# Patient Record
Sex: Female | Born: 1965 | Race: Black or African American | Hispanic: No | Marital: Single | Smoking: Never smoker
Health system: Southern US, Community
[De-identification: ages and names within clinical notes are randomized; demographics above are authoritative.]

## PROBLEM LIST (undated history)

## (undated) DIAGNOSIS — J302 Other seasonal allergic rhinitis: Secondary | ICD-10-CM

## (undated) DIAGNOSIS — T7840XA Allergy, unspecified, initial encounter: Secondary | ICD-10-CM

## (undated) DIAGNOSIS — G4730 Sleep apnea, unspecified: Secondary | ICD-10-CM

## (undated) HISTORY — PX: BREAST LUMPECTOMY: SHX2

## (undated) HISTORY — DX: Sleep apnea, unspecified: G47.30

## (undated) HISTORY — DX: Allergy, unspecified, initial encounter: T78.40XA

---

## 1992-10-27 DIAGNOSIS — I10 Essential (primary) hypertension: Secondary | ICD-10-CM

## 1992-10-27 HISTORY — DX: Essential (primary) hypertension: I10

## 2020-01-11 ENCOUNTER — Other Ambulatory Visit: Payer: Self-pay | Admitting: Family Medicine

## 2020-01-11 DIAGNOSIS — Z1231 Encounter for screening mammogram for malignant neoplasm of breast: Secondary | ICD-10-CM

## 2020-02-18 ENCOUNTER — Other Ambulatory Visit: Payer: Self-pay

## 2020-02-18 ENCOUNTER — Ambulatory Visit
Admission: RE | Admit: 2020-02-18 | Discharge: 2020-02-18 | Disposition: A | Discharge status: Home / Self Care | Payer: 59 | Source: Ambulatory Visit | Attending: Family Medicine | Admitting: Family Medicine

## 2020-02-18 DIAGNOSIS — Z1231 Encounter for screening mammogram for malignant neoplasm of breast: Secondary | ICD-10-CM

## 2020-02-22 ENCOUNTER — Other Ambulatory Visit: Payer: Self-pay | Admitting: Family Medicine

## 2020-02-22 DIAGNOSIS — R928 Other abnormal and inconclusive findings on diagnostic imaging of breast: Secondary | ICD-10-CM

## 2020-03-07 ENCOUNTER — Other Ambulatory Visit: Payer: Self-pay

## 2020-03-07 ENCOUNTER — Ambulatory Visit
Admission: RE | Admit: 2020-03-07 | Discharge: 2020-03-07 | Disposition: A | Discharge status: Home / Self Care | Payer: 59 | Source: Ambulatory Visit | Attending: Family Medicine | Admitting: Family Medicine

## 2020-03-07 ENCOUNTER — Other Ambulatory Visit: Payer: Self-pay | Admitting: Family Medicine

## 2020-03-07 DIAGNOSIS — R928 Other abnormal and inconclusive findings on diagnostic imaging of breast: Secondary | ICD-10-CM

## 2020-03-16 ENCOUNTER — Ambulatory Visit
Admission: RE | Admit: 2020-03-16 | Discharge: 2020-03-16 | Disposition: A | Discharge status: Home / Self Care | Payer: 59 | Source: Ambulatory Visit | Attending: Family Medicine | Admitting: Family Medicine

## 2020-03-16 ENCOUNTER — Other Ambulatory Visit: Payer: Self-pay

## 2020-03-16 DIAGNOSIS — R928 Other abnormal and inconclusive findings on diagnostic imaging of breast: Secondary | ICD-10-CM

## 2020-04-11 ENCOUNTER — Ambulatory Visit: Payer: Self-pay | Admitting: Surgery

## 2020-04-14 ENCOUNTER — Ambulatory Visit: Payer: 59 | Attending: Internal Medicine

## 2020-04-14 DIAGNOSIS — Z23 Encounter for immunization: Secondary | ICD-10-CM

## 2020-04-14 NOTE — Progress Notes (Signed)
   Covid-19 Vaccination Clinic  Name:  Zavannah Kemmerer    MRN: [id number] DOB: 05/04/1966  04/14/2020  Ms. Lecy was observed post Covid-19 immunization for 15 minutes without incident. She was provided with Vaccine Information Sheet and instruction to access the V-Safe system.   Ms. Hackett was instructed to call 911 with any severe reactions post vaccine: Marland Kitchen Difficulty breathing  . Swelling of face and throat  . A fast heartbeat  . A bad rash all over body  . Dizziness and weakness   Immunizations Administered    Name Date Dose VIS Date Route   Pfizer COVID-19 Vaccine 04/14/2020 11:54 AM 0.3 mL 02/10/2019 Intramuscular   Manufacturer: [place]   Lot: [id number]   [place]: [id number]

## 2020-05-09 ENCOUNTER — Ambulatory Visit: Payer: 59 | Attending: Internal Medicine

## 2020-05-09 DIAGNOSIS — Z23 Encounter for immunization: Secondary | ICD-10-CM

## 2020-05-09 NOTE — Progress Notes (Signed)
   Covid-19 Vaccination Clinic  Name:  [place]    MRN: [id number] DOB: [birth year]  05/09/2020  Ms. Sleep was observed post Covid-19 immunization for 15 minutes without incident. She was provided with Vaccine Information Sheet and instruction to access the V-Safe system.   [name] was instructed to call 911 with any severe reactions post vaccine: Marland Kitchen Difficulty breathing  . Swelling of face and throat  . A fast heartbeat  . A bad rash all over body  . Dizziness and weakness   Immunizations Administered    Name Date Dose VIS Date Route   Pfizer COVID-19 Vaccine 05/09/2020 11:22 AM 0.3 mL 02/10/2019 Intramuscular   Manufacturer: [place]   Lot: [id number]   [place]: [id number]

## 2021-02-14 ENCOUNTER — Other Ambulatory Visit: Payer: Self-pay | Admitting: Surgery

## 2021-02-14 DIAGNOSIS — N63 Unspecified lump in unspecified breast: Secondary | ICD-10-CM

## 2021-03-10 ENCOUNTER — Other Ambulatory Visit: Payer: Self-pay | Admitting: Surgery

## 2021-03-10 ENCOUNTER — Ambulatory Visit
Admission: RE | Admit: 2021-03-10 | Discharge: 2021-03-10 | Disposition: A | Discharge status: Home / Self Care | Payer: 59 | Source: Ambulatory Visit | Attending: Surgery | Admitting: Surgery

## 2021-03-10 ENCOUNTER — Other Ambulatory Visit: Payer: Self-pay

## 2021-03-10 DIAGNOSIS — N63 Unspecified lump in unspecified breast: Secondary | ICD-10-CM

## 2021-09-15 ENCOUNTER — Other Ambulatory Visit: Payer: 59

## 2021-09-20 ENCOUNTER — Ambulatory Visit
Admission: RE | Admit: 2021-09-20 | Discharge: 2021-09-20 | Disposition: A | Discharge status: Home / Self Care | Payer: 59 | Source: Ambulatory Visit | Attending: Surgery | Admitting: Surgery

## 2021-09-20 ENCOUNTER — Other Ambulatory Visit: Payer: Self-pay

## 2021-09-20 DIAGNOSIS — N63 Unspecified lump in unspecified breast: Secondary | ICD-10-CM

## 2022-04-09 ENCOUNTER — Other Ambulatory Visit: Payer: Self-pay | Admitting: Surgery

## 2022-04-09 DIAGNOSIS — N6310 Unspecified lump in the right breast, unspecified quadrant: Secondary | ICD-10-CM

## 2022-04-24 ENCOUNTER — Ambulatory Visit
Admission: RE | Admit: 2022-04-24 | Discharge: 2022-04-24 | Disposition: A | Discharge status: Home / Self Care | Payer: 59 | Source: Ambulatory Visit | Attending: Surgery | Admitting: Surgery

## 2022-04-24 ENCOUNTER — Other Ambulatory Visit: Payer: Self-pay | Admitting: Surgery

## 2022-04-24 DIAGNOSIS — N6310 Unspecified lump in the right breast, unspecified quadrant: Secondary | ICD-10-CM

## 2022-04-27 ENCOUNTER — Ambulatory Visit (INDEPENDENT_AMBULATORY_CARE_PROVIDER_SITE_OTHER): Payer: 59 | Admitting: Cardiovascular Disease

## 2022-04-27 ENCOUNTER — Encounter (HOSPITAL_BASED_OUTPATIENT_CLINIC_OR_DEPARTMENT_OTHER): Payer: Self-pay | Admitting: Cardiovascular Disease

## 2022-04-27 VITALS — BP 132/90 | HR 89 | Ht 64.0 in | Wt 172.2 lb

## 2022-04-27 DIAGNOSIS — D352 Benign neoplasm of pituitary gland: Secondary | ICD-10-CM

## 2022-04-27 DIAGNOSIS — R5383 Other fatigue: Secondary | ICD-10-CM

## 2022-04-27 DIAGNOSIS — Z6829 Body mass index (BMI) 29.0-29.9, adult: Secondary | ICD-10-CM | POA: Diagnosis not present

## 2022-04-27 DIAGNOSIS — R0683 Snoring: Secondary | ICD-10-CM

## 2022-04-27 DIAGNOSIS — G8929 Other chronic pain: Secondary | ICD-10-CM

## 2022-04-27 DIAGNOSIS — I10 Essential (primary) hypertension: Secondary | ICD-10-CM

## 2022-04-27 DIAGNOSIS — M2550 Pain in unspecified joint: Secondary | ICD-10-CM | POA: Diagnosis not present

## 2022-04-27 DIAGNOSIS — E237 Disorder of pituitary gland, unspecified: Secondary | ICD-10-CM

## 2022-04-27 DIAGNOSIS — R400 Somnolence: Secondary | ICD-10-CM

## 2022-04-27 MED ORDER — CHLORTHALIDONE 25 MG PO TABS: ORAL_TABLET | ORAL | 1 refills | Status: DC

## 2022-04-27 NOTE — Patient Instructions (Signed)
Medication Instructions:  ?START CHLORTHALIDONE 25 MG 1/2 TABLET DAILY  ?  ?Labwork: ?FASTING LP/CMET/TSH/A1C/ESR/PROLACTIN 1 WEEK  ?  ?Testing/Procedures: ?HOME SLEEP STUDY  ?  ?Follow-Up: ?2 MONTHS WITH PHARM D  ? ?6 MONTHS WITH DR [place]  ?  ?Special Instructions:  ? ?Primary Care:  ?Dr. Glendale Chard ?Dr. Grier Mitts ?Dr. Olivia Mackie McLean-Scocuzza ? ?DASH Eating Plan ?DASH stands for "Dietary Approaches to Stop Hypertension." The DASH eating plan is a healthy eating plan that has been shown to reduce high blood pressure (hypertension). It may also reduce your risk for type 2 diabetes, heart disease, and stroke. The DASH eating plan may also help with weight loss. ?What are tips for following this plan? ? ?General guidelines ?Avoid eating more than 2,300 mg (milligrams) of salt (sodium) a day. If you have hypertension, you may need to reduce your sodium intake to 1,500 mg a day. ?Limit alcohol intake to no more than 1 drink a day for nonpregnant women and 2 drinks a day for men. One drink equals 12 oz of beer, 5 oz of wine, or 1? oz of hard liquor. ?Work with your health care provider to maintain a healthy body weight or to lose weight. Ask what an ideal weight is for you. ?Get at least 30 minutes of exercise that causes your heart to beat faster (aerobic exercise) most days of the week. Activities may include walking, swimming, or biking. ?Work with your health care provider or diet and nutrition specialist (dietitian) to adjust your eating plan to your individual calorie needs. ?Reading food labels ? ?Check food labels for the amount of sodium per serving. Choose foods with less than 5 percent of the Daily Value of sodium. Generally, foods with less than 300 mg of sodium per serving fit into this eating plan. ?To find whole grains, look for the word "whole" as the first word in the ingredient list. ?Shopping ?Buy products labeled as "low-sodium" or "no salt added." ?Buy fresh foods. Avoid canned foods and  premade or frozen meals. ?Cooking ?Avoid adding salt when cooking. Use salt-free seasonings or herbs instead of table salt or sea salt. Check with your health care provider or pharmacist before using salt substitutes. ?Do not fry foods. Cook foods using healthy methods such as baking, boiling, grilling, and broiling instead. ?Cook with heart-healthy oils, such as olive, canola, soybean, or sunflower oil. ?Meal planning ?Eat a balanced diet that includes: ?5 or more servings of fruits and vegetables each day. At each meal, try to fill half of your plate with fruits and vegetables. ?Up to 6-8 servings of whole grains each day. ?Less than 6 oz of lean meat, poultry, or fish each day. A 3-oz serving of meat is about the same size as a deck of cards. One egg equals 1 oz. ?2 servings of low-fat dairy each day. ?A serving of nuts, seeds, or beans 5 times each week. ?Heart-healthy fats. Healthy fats called Omega-3 fatty acids are found in foods such as flaxseeds and coldwater fish, like sardines, salmon, and mackerel. ?Limit how much you eat of the following: ?Canned or prepackaged foods. ?Food that is high in trans fat, such as fried foods. ?Food that is high in saturated fat, such as fatty meat. ?Sweets, desserts, sugary drinks, and other foods with added sugar. ?Full-fat dairy products. ?Do not salt foods before eating. ?Try to eat at least 2 vegetarian meals each week. ?Eat more home-cooked food and less restaurant, buffet, and fast food. ?When eating at a restaurant,  ask that your food be prepared with less salt or no salt, if possible. ?What foods are recommended? ?The items listed may not be a complete list. Talk with your dietitian about what dietary choices are best for you. ?Grains ?Whole-grain or whole-wheat bread. Whole-grain or whole-wheat pasta. Brown rice. Modena Morrow. Bulgur. Whole-grain and low-sodium cereals. Pita bread. Low-fat, low-sodium crackers. Whole-wheat flour tortillas. ?Vegetables ?Fresh or  frozen vegetables (raw, steamed, roasted, or grilled). Low-sodium or reduced-sodium tomato and vegetable juice. Low-sodium or reduced-sodium tomato sauce and tomato paste. Low-sodium or reduced-sodium canned vegetables. ?Fruits ?All fresh, dried, or frozen fruit. Canned fruit in natural juice (without added sugar). ?Meat and other protein foods ?Skinless chicken or [place]. Ground chicken or [place]. Pork with fat trimmed off. Fish and seafood. Egg whites. Dried beans, peas, or lentils. Unsalted nuts, nut butters, and seeds. Unsalted canned beans. Lean cuts of beef with fat trimmed off. Low-sodium, lean deli meat. ?Dairy ?Low-fat (1%) or fat-free (skim) milk. Fat-free, low-fat, or reduced-fat cheeses. Nonfat, low-sodium ricotta or cottage cheese. Low-fat or nonfat yogurt. Low-fat, low-sodium cheese. ?Fats and oils ?Soft margarine without trans fats. Vegetable oil. Low-fat, reduced-fat, or light mayonnaise and salad dressings (reduced-sodium). Canola, safflower, olive, soybean, and sunflower oils. Avocado. ?Seasoning and other foods ?Herbs. Spices. Seasoning mixes without salt. Unsalted popcorn and pretzels. Fat-free sweets. ?What foods are not recommended? ?The items listed may not be a complete list. Talk with your dietitian about what dietary choices are best for you. ?Grains ?Baked goods made with fat, such as croissants, muffins, or some breads. Dry pasta or rice meal packs. ?Vegetables ?Creamed or fried vegetables. Vegetables in a cheese sauce. Regular canned vegetables (not low-sodium or reduced-sodium). Regular canned tomato sauce and paste (not low-sodium or reduced-sodium). Regular tomato and vegetable juice (not low-sodium or reduced-sodium). Angie Fava. Olives. ?Fruits ?Canned fruit in a light or heavy syrup. Fried fruit. Fruit in cream or butter sauce. ?Meat and other protein foods ?Fatty cuts of meat. Ribs. Fried meat. Berniece Salines. Sausage. Bologna and other processed lunch meats. Salami. Fatback. Hotdogs.  Bratwurst. Salted nuts and seeds. Canned beans with added salt. Canned or smoked fish. Whole eggs or egg yolks. Chicken or [place] with skin. ?Dairy ?Whole or 2% milk, cream, and half-and-half. Whole or full-fat cream cheese. Whole-fat or sweetened yogurt. Full-fat cheese. Nondairy creamers. Whipped toppings. Processed cheese and cheese spreads. ?Fats and oils ?Butter. Stick margarine. Lard. Shortening. Ghee. Bacon fat. Tropical oils, such as coconut, palm kernel, or palm oil. ?Seasoning and other foods ?Salted popcorn and pretzels. Onion salt, garlic salt, seasoned salt, table salt, and sea salt. Worcestershire sauce. Tartar sauce. Barbecue sauce. Teriyaki sauce. Soy sauce, including reduced-sodium. Steak sauce. Canned and packaged gravies. Fish sauce. Oyster sauce. Cocktail sauce. Horseradish that you find on the shelf. Ketchup. Mustard. Meat flavorings and tenderizers. Bouillon cubes. Hot sauce and Tabasco sauce. Premade or packaged marinades. Premade or packaged taco seasonings. Relishes. Regular salad dressings. ?Where to find more information: ?National Heart, Lung, and Blood Institute: https://wilson-eaton.com/ ?American Heart Association: www.heart.org ?Summary ?The DASH eating plan is a healthy eating plan that has been shown to reduce high blood pressure (hypertension). It may also reduce your risk for type 2 diabetes, heart disease, and stroke. ?With the DASH eating plan, you should limit salt (sodium) intake to 2,300 mg a day. If you have hypertension, you may need to reduce your sodium intake to 1,500 mg a day. ?When on the DASH eating plan, aim to eat more fresh fruits and vegetables, whole grains,  lean proteins, low-fat dairy, and heart-healthy fats. ?Work with your health care provider or diet and nutrition specialist (dietitian) to adjust your eating plan to your individual calorie needs. ?This information is not intended to replace advice given to you by your health care provider. Make sure you discuss any  questions you have with your health care provider. ?Document Released: 11/22/2011 Document Revised: 11/15/2017 Document Reviewed: 11/26/2016 ?Elsevier Patient Education ? [place].  ? ? ? ?

## 2022-04-27 NOTE — Progress Notes (Signed)
Advanced Hypertension Clinic Initial Assessment:    Date:  06/06/2022   ID:  STEPH[place]I Arellano, DOB Nov 10, 1966, MRN [id number]  PCP:  Donald Prose, MD  Cardiologist:  None  Nephrologist:  Referring MD: Servando Salina, MD   CC: Hypertension  History of Present Illness:    [place] is a 56 y.o. female with a hx of hypertension, pituitary adenoma, here to establish care in the Advanced Hypertension Clinic. Her BP was 163/103 with Dr. Garwin Brothers on 02/2022.  She was not on any medication at the time.  Prior to her visit with Dr. Garwin Brothers her BP was running high. She was previously on low doses of nebivolol and chlorthalidone.  They recommended that she wean her BP meds off. Lately her home BP has been in the 120s-130s/80-90s.   She does pilates for exercise.  She hasn't been doing it lately 2/2 sinus infection and she had a colonoscopy this week.  She also had a lot of travel.  With exercise she has been able to lose weight. She has no exertional chest pain or shortness of breath.  She has some swelling in her L leg at times.  It improves with elevation.  She denies orthopnea or PND.  She reports snoring and daytime somnolence.  She currently works at the [employer].  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  Previous antihypertensives: Nebivolol Chlorthalidone   Past Medical History:  Diagnosis Date   Seasonal allergies    Snoring 06/06/2022    Past Surgical History:  Procedure Laterality Date   BREAST LUMPECTOMY Right    MASS EXCISION N/A 05/28/2022   Procedure: EXCISION MASS POSTERIOR NECK;  Surgeon: Georganna Skeans, MD;  Location: [facility];  Service: General;  Laterality: N/A;    Current Medications: Current Meds  Medication Sig   chlorthalidone (HYGROTON) 25 MG tablet TAKE 1/2 TABLET DAILY     Allergies:   Viteyes smokers formula-lutein [actical], Grass pollen(k-o-r-t-swt vern), Pollen extract-tree extract [pollen extract], Cat hair extract, and  Latex   Social History   Socioeconomic History   Marital status: Single    Spouse name: Not on file   Number of children: Not on file   Years of education: Not on file   Highest education level: Not on file  Occupational History   Not on file  Tobacco Use   Smoking status: Never   Smokeless tobacco: Never  Substance and Sexual Activity   Alcohol use: Yes    Comment: Occassionally   Drug use: Never   Sexual activity: Not on file  Other Topics Concern   Not on file  Social History Narrative   Not on file   Social Determinants of Health   Financial Resource Strain: Not on file  Food Insecurity: Not on file  Transportation Needs: Not on file  Physical Activity: Not on file  Stress: Not on file  Social Connections: Not on file     Family History: The patient's family history includes Asthma in her sister; Diabetes in her brother and brother; Hypertension in her brother and mother.  ROS:   Please see the history of present illness.    All other systems reviewed and are negative.  EKGs/Labs/Other Studies Reviewed:    EKG:  EKG is ordered today.  The ekg ordered today demonstrates sinus rhythm.  Rate 89 bopm.  Nonspecific T wave abnormalities.  Recent Labs: 05/15/2022: ALT 20; BUN 10; Creatinine, Ser 0.91; Potassium 3.7; Sodium 139; TSH  1.690   Recent Lipid Panel    Component Value Date/Time   CHOL 238 (H) 05/15/2022 1330   TRIG 132 05/15/2022 1330   HDL 65 05/15/2022 1330   CHOLHDL 3.7 05/15/2022 1330   LDLCALC 150 (H) 05/15/2022 1330    Physical Exam:   VS:  BP 132/90 (BP Location: Left Arm, Patient Position: Sitting, Cuff Size: Normal)   Pulse 89   Ht _0  (1.626 m)   Wt 172 lb 3.2 oz (78.1 kg)   BMI 29.56 kg/m  , BMI Body mass index is 29.56 kg/m. GENERAL:  Well appearing HEENT: Pupils equal round and reactive, fundi not visualized, oral mucosa unremarkable NECK:  No jugular venous distention, waveform within normal limits, carotid upstroke brisk and  symmetric, no bruits, no thyromegaly LUNGS:  Clear to auscultation bilaterally HEART:  RRR.  PMI not displaced or sustained,S1 and S2 within normal limits, no S3, no S4, no clicks, no rubs, no murmurs ABD:  Flat, positive bowel sounds normal in frequency in pitch, no bruits, no rebound, no guarding, no midline pulsatile mass, no hepatomegaly, no splenomegaly EXT:  2 plus pulses throughout, no edema, no cyanosis no clubbing SKIN:  No rashes no nodules NEURO:  Cranial nerves II through XII grossly intact, motor grossly intact throughout PSYCH:  Cognitively intact, oriented to person place and time   ASSESSMENT/PLAN:    Snoring She has a history of snoring and daytime somnolence..  Will refer for sleep study.  High blood pressure Blood pressure has been above goal.  Recommend resuming chlorthalidone at 12.27m daily.  Check CMP, TSH and lipids in a week.  Pituitary abnormality (HCC) History of galactorrhea.  Check prolactin level and ESR.   Screening for Secondary Hypertension:     04/27/2022   12:18 PM  Causes  Drugs/Herbals Screened     - Comments struggles with sodium.  rare caffeine.  Very rare EtOH    Relevant Labs/Studies:    Latest Ref Rng & Units 05/15/2022    1:30 PM  Basic Labs  Sodium 134 - 144 mmol/L 139   Potassium 3.5 - 5.2 mmol/L 3.7   Creatinine 0.57 - 1.00 mg/dL 0.91        Latest Ref Rng & Units 05/15/2022    1:30 PM  Thyroid   TSH 0.450 - 4.500 uIU/mL 1.690              Disposition:    FU with MD/PharmD in 2 months.  Warnie Belair C. ROval Linsey MD, [facility]in 6 months.    Medication Adjustments/Labs and Tests Ordered: Current medicines are reviewed at length with the patient today.  Concerns regarding medicines are outlined above.  Orders Placed This Encounter  Procedures   TSH   Lipid panel   Comprehensive metabolic panel   Prolactin   HgB A1c   Sedimentation rate   AMB Referral to [facility] Pharm-D   EKG 12-Lead   Home sleep test   Meds ordered  this encounter  Medications   chlorthalidone (HYGROTON) 25 MG tablet    Sig: TAKE 1/2 TABLET DAILY    Dispense:  45 tablet    Refill:  1     Signed, TSkeet Latch MD  06/06/2022 7:25 AM    [place]

## 2022-04-30 LAB — HM COLONOSCOPY

## 2022-05-09 ENCOUNTER — Ambulatory Visit: Payer: Self-pay | Admitting: General Surgery

## 2022-05-15 ENCOUNTER — Ambulatory Visit (HOSPITAL_BASED_OUTPATIENT_CLINIC_OR_DEPARTMENT_OTHER): Payer: 59 | Attending: Cardiovascular Disease | Admitting: Cardiovascular Disease

## 2022-05-15 DIAGNOSIS — R400 Somnolence: Secondary | ICD-10-CM | POA: Diagnosis present

## 2022-05-15 DIAGNOSIS — R5383 Other fatigue: Secondary | ICD-10-CM | POA: Diagnosis not present

## 2022-05-15 DIAGNOSIS — Z6829 Body mass index (BMI) 29.0-29.9, adult: Secondary | ICD-10-CM | POA: Diagnosis not present

## 2022-05-15 DIAGNOSIS — G4733 Obstructive sleep apnea (adult) (pediatric): Secondary | ICD-10-CM | POA: Insufficient documentation

## 2022-05-16 LAB — LIPID PANEL
Chol/HDL Ratio: 3.7 ratio (ref 0.0–4.4)
Cholesterol, Total: 238 mg/dL — ABNORMAL HIGH (ref 100–199)
HDL: 65 mg/dL (ref >39)
LDL Chol Calc (NIH): 150 mg/dL — ABNORMAL HIGH (ref 0–99)
Triglycerides: 132 mg/dL (ref 0–149)
VLDL Cholesterol Cal: 23 mg/dL (ref 5–40)

## 2022-05-16 LAB — COMPREHENSIVE METABOLIC PANEL
ALT: 20 IU/L (ref 0–32)
AST: 19 IU/L (ref 0–40)
Albumin/Globulin Ratio: 1.7 (ref 1.2–2.2)
Albumin: 4.5 g/dL (ref 3.8–4.9)
Alkaline Phosphatase: 96 IU/L (ref 44–121)
BUN/Creatinine Ratio: 11 (ref 9–23)
BUN: 10 mg/dL (ref 6–24)
Bilirubin Total: 0.6 mg/dL (ref 0.0–1.2)
CO2: 21 mmol/L (ref 20–29)
Calcium: 9.2 mg/dL (ref 8.7–10.2)
Chloride: 103 mmol/L (ref 96–106)
Creatinine, Ser: 0.91 mg/dL (ref 0.57–1.00)
Globulin, Total: 2.7 g/dL (ref 1.5–4.5)
Glucose: 92 mg/dL (ref 70–99)
Potassium: 3.7 mmol/L (ref 3.5–5.2)
Sodium: 139 mmol/L (ref 134–144)
Total Protein: 7.2 g/dL (ref 6.0–8.5)
eGFR: 74 mL/min/{1.73_m2} (ref >59)

## 2022-05-16 LAB — HEMOGLOBIN A1C
Est. average glucose Bld gHb Est-mCnc: 117 mg/dL
Hgb A1c MFr Bld: 5.7 % — ABNORMAL HIGH (ref 4.8–5.6)

## 2022-05-16 LAB — SEDIMENTATION RATE: Sed Rate: 15 mm/hr (ref 0–40)

## 2022-05-16 LAB — PROLACTIN: Prolactin: 6.9 ng/mL (ref 4.8–23.3)

## 2022-05-16 LAB — TSH: TSH: 1.69 u[IU]/mL (ref 0.450–4.500)

## 2022-05-24 ENCOUNTER — Encounter (HOSPITAL_BASED_OUTPATIENT_CLINIC_OR_DEPARTMENT_OTHER): Payer: Self-pay | Admitting: Cardiovascular Disease

## 2022-05-24 NOTE — Procedures (Signed)
       Patient Name: [place] Arellano, Ben[place] Date: 05/15/2022 Gender: Female D.O.B: Jul 20, 1966 Age (years): 55 Referring Provider: Skeet Latch Height (inches): 64 Interpreting Physician: Shelva Majestic MD, ABSM Weight (lbs): 172 RPSGT: Jacolyn Reedy BMI: 30 MRN: [id number] Neck Size: 14.00  CLINICAL INFORMATION Sleep Study Type: HST  Indication for sleep study: Snoring, daytime sleepiness  Epworth Sleepiness Score: 10  SLEEP STUDY TECHNIQUE A multi-channel overnight portable sleep study was performed. The channels recorded were: nasal airflow, thoracic respiratory movement, and oxygen saturation with a pulse oximetry. Snoring was also monitored.  MEDICATIONS chlorthalidone (HYGROTON) 25 MG tablet Coenzyme Q10 (COQ10 GUMMIES ADULT) 50 MG CHEW fexofenadine (ALLEGRA) 180 MG tablet ketotifen ([employer] ALLERGY EYE DROPS) 0.025 % ophthalmic solution L-Lysine 500 MG CAPS METAMUCIL FIBER PO Omega-3 Fatty Acids (FISH OIL) 1000 MG CAPS Probiotic Product (PROBIOTIC-10) CHEW Turmeric Curcumin 500 MG CAPS vitamin B-12 (CYANOCOBALAMIN) 1000 MCG tablet Patient self administered medications include: N/A.  SLEEP ARCHITECTURE Patient was studied for 370 minutes. The sleep efficiency was 100.0 % and the patient was supine for 0%. The arousal index was 0.0 per hour.  RESPIRATORY PARAMETERS The overall AHI was 27.7 per hour, with a central apnea index of 0 per hour. There is a positional component with supine sleep AHI 28.6/h vs non-supine sleep AHI 10.5/h.  The oxygen nadir was 82% during sleep.Time spent < 89% was 1.7 minutes.  CARDIAC DATA Mean heart rate during sleep was 75.5 bpm. Heart rate range was 59 - 101 bpm.  IMPRESSIONS - Moderate obstructive sleep apnea occurred during this study (AHI 27.7/h). - Moderate oxygen desaturation to a nadir of 82%. - Patient snored for 69.7 minutes (18.8%) during the sleep.  DIAGNOSIS - Obstructive Sleep Apnea  (G47.33)  RECOMMENDATIONS - Recommend therapeutic CPAP for treatment of the patient's moderate sleep disordered breathing. If unable to obtain an in-lab titration initiate Auto-PAP with EPR of 3 at 7 - 18 cm of water. - Effort should be made to optimize nasal and oropharyngeal patency. - Positional therapy avoiding supine position during sleep. - Avoid alcohol, sedatives and other CNS depressants that may worsen sleep apnea and disrupt normal sleep architecture. - Sleep hygiene should be reviewed to assess factors that may improve sleep quality. - Weight management and regular exercise should be initiated or continued. - Recommend a download and sleep clinic evaluation after one month of therapy.    [Electronically signed] 05/24/2022 08:54 PM  Shelva Majestic MD, [facility], [place], American Board of Sleep Medicine  NPI: [id number]  [place] PH: [phone]   FX: [phone] [place]

## 2022-05-25 ENCOUNTER — Encounter (HOSPITAL_COMMUNITY): Payer: Self-pay | Admitting: General Surgery

## 2022-05-25 NOTE — Progress Notes (Signed)
[facility], no answer.  Left a detailed message on machine with instructions for day of surgery.  PCP - Dr Skeet Latch Cardiologist - Dr Shelva Majestic  Chest x-ray - n/a EKG - 04/27/22 Stress Test - n/a ECHO - n/a Cardiac Cath - n/a  ICD Pacemaker/Loop - n/a  Sleep Study -  Yes on 05/22/22 CPAP - ask on DOS  ERAS: Clear liquids til 6 AM DOS.  STOP now taking any Aspirin (unless otherwise instructed by your surgeon), Aleve, Naproxen, Ibuprofen, Motrin, Advil, Goody's, BC's, all herbal medications, fish oil, and all vitamins.

## 2022-05-28 ENCOUNTER — Ambulatory Visit (HOSPITAL_COMMUNITY): Payer: 59 | Admitting: General Practice

## 2022-05-28 ENCOUNTER — Encounter (HOSPITAL_COMMUNITY)
Admission: AD | Disposition: A | Discharge status: Home / Self Care | Payer: Self-pay | Source: Home / Self Care | Attending: General Surgery

## 2022-05-28 ENCOUNTER — Ambulatory Visit (HOSPITAL_COMMUNITY)
Admission: AD | Admit: 2022-05-28 | Discharge: 2022-05-28 | Disposition: A | Discharge status: Home / Self Care | Payer: 59 | Attending: General Surgery | Admitting: General Surgery

## 2022-05-28 ENCOUNTER — Encounter (HOSPITAL_COMMUNITY): Payer: Self-pay | Admitting: General Surgery

## 2022-05-28 ENCOUNTER — Other Ambulatory Visit: Payer: Self-pay

## 2022-05-28 ENCOUNTER — Ambulatory Visit (HOSPITAL_BASED_OUTPATIENT_CLINIC_OR_DEPARTMENT_OTHER): Payer: 59 | Admitting: General Practice

## 2022-05-28 DIAGNOSIS — R221 Localized swelling, mass and lump, neck: Secondary | ICD-10-CM | POA: Diagnosis not present

## 2022-05-28 DIAGNOSIS — D170 Benign lipomatous neoplasm of skin and subcutaneous tissue of head, face and neck: Secondary | ICD-10-CM | POA: Insufficient documentation

## 2022-05-28 HISTORY — PX: MASS EXCISION: SHX2000

## 2022-05-28 HISTORY — DX: Other seasonal allergic rhinitis: J30.2

## 2022-05-28 SURGERY — EXCISION MASS
Anesthesia: General

## 2022-05-28 MED ORDER — OXYCODONE HCL 5 MG/5ML PO SOLN: 5.0000 mg | Freq: Once | ORAL | Status: DC | PRN

## 2022-05-28 MED ORDER — ACETAMINOPHEN 500 MG PO TABS
1000.0000 mg | ORAL_TABLET | ORAL | Status: AC
  Administered 2022-05-28: 1000 mg via ORAL
  Filled 2022-05-28: qty 2

## 2022-05-28 MED ORDER — PROPOFOL 10 MG/ML IV BOLUS
INTRAVENOUS | Status: DC | PRN
  Administered 2022-05-28: 130 mg via INTRAVENOUS

## 2022-05-28 MED ORDER — PHENYLEPHRINE 80 MCG/ML (10ML) SYRINGE FOR IV PUSH (FOR BLOOD PRESSURE SUPPORT)
PREFILLED_SYRINGE | INTRAVENOUS | Status: DC | PRN
  Administered 2022-05-28: 80 ug via INTRAVENOUS

## 2022-05-28 MED ORDER — ONDANSETRON HCL 4 MG/2ML IJ SOLN
INTRAMUSCULAR | Status: DC | PRN
  Administered 2022-05-28: 4 mg via INTRAVENOUS

## 2022-05-28 MED ORDER — MIDAZOLAM HCL 2 MG/2ML IJ SOLN
INTRAMUSCULAR | Status: AC
  Filled 2022-05-28: qty 2

## 2022-05-28 MED ORDER — DEXAMETHASONE SODIUM PHOSPHATE 10 MG/ML IJ SOLN
INTRAMUSCULAR | Status: AC
  Filled 2022-05-28: qty 1

## 2022-05-28 MED ORDER — CHLORHEXIDINE GLUCONATE CLOTH 2 % EX PADS: 6.0000 | MEDICATED_PAD | Freq: Once | CUTANEOUS | Status: DC

## 2022-05-28 MED ORDER — MIDAZOLAM HCL 2 MG/2ML IJ SOLN
INTRAMUSCULAR | Status: DC | PRN
  Administered 2022-05-28: 2 mg via INTRAVENOUS

## 2022-05-28 MED ORDER — FENTANYL CITRATE (PF) 250 MCG/5ML IJ SOLN
INTRAMUSCULAR | Status: AC
  Filled 2022-05-28: qty 5

## 2022-05-28 MED ORDER — ROCURONIUM BROMIDE 10 MG/ML (PF) SYRINGE
PREFILLED_SYRINGE | INTRAVENOUS | Status: AC
  Filled 2022-05-28: qty 10

## 2022-05-28 MED ORDER — SUGAMMADEX SODIUM 200 MG/2ML IV SOLN
INTRAVENOUS | Status: DC | PRN
  Administered 2022-05-28: 100 mg via INTRAVENOUS
  Administered 2022-05-28: 200 mg via INTRAVENOUS

## 2022-05-28 MED ORDER — DEXAMETHASONE SODIUM PHOSPHATE 10 MG/ML IJ SOLN
INTRAMUSCULAR | Status: DC | PRN
  Administered 2022-05-28: 10 mg via INTRAVENOUS

## 2022-05-28 MED ORDER — LIDOCAINE 2% (20 MG/ML) 5 ML SYRINGE
INTRAMUSCULAR | Status: DC | PRN
  Administered 2022-05-28: 60 mg via INTRAVENOUS

## 2022-05-28 MED ORDER — CEFAZOLIN SODIUM-DEXTROSE 2-4 GM/100ML-% IV SOLN
2.0000 g | INTRAVENOUS | Status: AC
  Administered 2022-05-28: 2 g via INTRAVENOUS
  Filled 2022-05-28: qty 100

## 2022-05-28 MED ORDER — LACTATED RINGERS IV SOLN: INTRAVENOUS | Status: DC

## 2022-05-28 MED ORDER — ROCURONIUM BROMIDE 10 MG/ML (PF) SYRINGE
PREFILLED_SYRINGE | INTRAVENOUS | Status: DC | PRN
  Administered 2022-05-28: 60 mg via INTRAVENOUS

## 2022-05-28 MED ORDER — BUPIVACAINE-EPINEPHRINE 0.5% -1:200000 IJ SOLN
INTRAMUSCULAR | Status: DC | PRN
  Administered 2022-05-28: 20 mL

## 2022-05-28 MED ORDER — PHENYLEPHRINE 80 MCG/ML (10ML) SYRINGE FOR IV PUSH (FOR BLOOD PRESSURE SUPPORT)
PREFILLED_SYRINGE | INTRAVENOUS | Status: AC
  Filled 2022-05-28: qty 10

## 2022-05-28 MED ORDER — LIDOCAINE 2% (20 MG/ML) 5 ML SYRINGE
INTRAMUSCULAR | Status: AC
  Filled 2022-05-28: qty 5

## 2022-05-28 MED ORDER — FENTANYL CITRATE (PF) 100 MCG/2ML IJ SOLN: 25.0000 ug | INTRAMUSCULAR | Status: DC | PRN

## 2022-05-28 MED ORDER — BUPIVACAINE-EPINEPHRINE 0.5% -1:200000 IJ SOLN
INTRAMUSCULAR | Status: AC
  Filled 2022-05-28: qty 1

## 2022-05-28 MED ORDER — FENTANYL CITRATE (PF) 250 MCG/5ML IJ SOLN
INTRAMUSCULAR | Status: DC | PRN
Start: 2022-05-28 — End: 2022-05-28
  Administered 2022-05-28: 100 ug via INTRAVENOUS
  Administered 2022-05-28: 50 ug via INTRAVENOUS

## 2022-05-28 MED ORDER — GABAPENTIN 300 MG PO CAPS
300.0000 mg | ORAL_CAPSULE | ORAL | Status: AC
  Administered 2022-05-28: 300 mg via ORAL
  Filled 2022-05-28: qty 1

## 2022-05-28 MED ORDER — PHENYLEPHRINE 80 MCG/ML (10ML) SYRINGE FOR IV PUSH (FOR BLOOD PRESSURE SUPPORT)
PREFILLED_SYRINGE | INTRAVENOUS | Status: AC
Start: 2022-05-28 — End: ?
  Filled 2022-05-28: qty 10

## 2022-05-28 MED ORDER — ONDANSETRON HCL 4 MG/2ML IJ SOLN
INTRAMUSCULAR | Status: AC
  Filled 2022-05-28: qty 2

## 2022-05-28 MED ORDER — 0.9 % SODIUM CHLORIDE (POUR BTL) OPTIME
TOPICAL | Status: DC | PRN
  Administered 2022-05-28: 100 mL

## 2022-05-28 MED ORDER — OXYCODONE HCL 5 MG PO TABS: 5.0000 mg | ORAL_TABLET | Freq: Once | ORAL | Status: DC | PRN

## 2022-05-28 MED ORDER — OXYCODONE HCL 5 MG PO TABS: 5.0000 mg | ORAL_TABLET | Freq: Four times a day (QID) | ORAL | 0 refills | Status: AC | PRN

## 2022-05-28 MED ORDER — PROPOFOL 10 MG/ML IV BOLUS
INTRAVENOUS | Status: AC
  Filled 2022-05-28: qty 20

## 2022-05-28 MED ORDER — ORAL CARE MOUTH RINSE: 15.0000 mL | Freq: Once | OROMUCOSAL | Status: AC

## 2022-05-28 MED ORDER — CELECOXIB 200 MG PO CAPS
400.0000 mg | ORAL_CAPSULE | ORAL | Status: AC
  Administered 2022-05-28: 400 mg via ORAL
  Filled 2022-05-28: qty 2

## 2022-05-28 MED ORDER — CHLORHEXIDINE GLUCONATE 0.12 % MT SOLN
15.0000 mL | Freq: Once | OROMUCOSAL | Status: AC
  Administered 2022-05-28: 15 mL via OROMUCOSAL
  Filled 2022-05-28: qty 15

## 2022-05-28 SURGICAL SUPPLY — 32 items
CANISTER SUCT 3000ML PPV (MISCELLANEOUS) ×1 IMPLANT
CHLORAPREP W/TINT 26 (MISCELLANEOUS) ×2 IMPLANT
COVER SURGICAL LIGHT HANDLE (MISCELLANEOUS) ×2 IMPLANT
DERMABOND ADVANCED (GAUZE/BANDAGES/DRESSINGS) ×1
DERMABOND ADVANCED .7 DNX12 (GAUZE/BANDAGES/DRESSINGS) IMPLANT
DRAPE LAPAROTOMY 100X72 PEDS (DRAPES) ×2 IMPLANT
ELECT REM PT RETURN 9FT ADLT (ELECTROSURGICAL) ×2
ELECTRODE REM PT RTRN 9FT ADLT (ELECTROSURGICAL) ×1 IMPLANT
GAUZE SPONGE 4X4 12PLY STRL (GAUZE/BANDAGES/DRESSINGS) IMPLANT
GLOVE BIOGEL PI IND STRL 8 (GLOVE) ×1 IMPLANT
GLOVE BIOGEL PI INDICATOR 8 (GLOVE) ×1
GLOVE SURG SS PI 7.0 STRL IVOR (GLOVE) ×1 IMPLANT
GLOVE SURG SS PI 8.0 STRL IVOR (GLOVE) ×1 IMPLANT
GOWN STRL REUS W/ TWL LRG LVL3 (GOWN DISPOSABLE) ×1 IMPLANT
GOWN STRL REUS W/ TWL XL LVL3 (GOWN DISPOSABLE) ×1 IMPLANT
GOWN STRL REUS W/TWL LRG LVL3 (GOWN DISPOSABLE) ×1
GOWN STRL REUS W/TWL XL LVL3 (GOWN DISPOSABLE) ×1
KIT BASIN OR (CUSTOM PROCEDURE TRAY) ×2 IMPLANT
KIT TURNOVER KIT B (KITS) ×2 IMPLANT
NDL HYPO 25X1 1.5 SAFETY (NEEDLE) ×1 IMPLANT
NEEDLE HYPO 25X1 1.5 SAFETY (NEEDLE) IMPLANT
NS IRRIG 1000ML POUR BTL (IV SOLUTION) ×2 IMPLANT
PACK GENERAL/GYN (CUSTOM PROCEDURE TRAY) ×2 IMPLANT
PAD ARMBOARD 7.5X6 YLW CONV (MISCELLANEOUS) ×2 IMPLANT
SPECIMEN JAR SMALL (MISCELLANEOUS) ×2 IMPLANT
STRIP CLOSURE SKIN 1/2X4 (GAUZE/BANDAGES/DRESSINGS) IMPLANT
SUT MNCRL AB 4-0 PS2 18 (SUTURE) ×2 IMPLANT
SUT VIC AB 3-0 SH 27 (SUTURE)
SUT VIC AB 3-0 SH 27X BRD (SUTURE) ×1 IMPLANT
SYR CONTROL 10ML LL (SYRINGE) ×2 IMPLANT
TOWEL GREEN STERILE (TOWEL DISPOSABLE) ×2 IMPLANT
TOWEL GREEN STERILE FF (TOWEL DISPOSABLE) ×2 IMPLANT

## 2022-05-28 NOTE — Op Note (Signed)
  05/28/2022  9:36 AM  PATIENT:  [place]  56 y.o. female  PRE-OPERATIVE DIAGNOSIS:  MASS POSTERIOR NECK 4CM  POST-OPERATIVE DIAGNOSIS:  MASS POSTERIOR NECK 4CM  PROCEDURE:  Procedure(s): EXCISION MASS POSTERIOR NECK 4CM WITH LAYERED CLOSURE  SURGEON:  Surgeon(s): Georganna Skeans, MD  ASSISTANTS: none   ANESTHESIA:   local and general  EBL:  Total I/O In: 600 [I.V.:500; IV Piggyback:100] Out: 10 [Blood:10]  BLOOD ADMINISTERED:none  DRAINS: none   SPECIMEN:  Excision  DISPOSITION OF SPECIMEN:  PATHOLOGY  COUNTS:  YES  DICTATION: .Dragon Dictation Procedure in detail: Informed consent was obtained.  Her site was marked.  She received intravenous antibiotics.  She was brought to the operating room and general endotracheal anesthesia was administered by the anesthesia staff.  She was placed in prone position.  Her posterior neck was prepped and draped in a sterile fashion.  We did a timeout procedure.  A transverse incision was made over this palpable mass along her previous scar.  Subcutaneous tissues were dissected down revealing a somewhat lobulated mass.  This was circumferentially dissected and completely excised using cautery and sharp dissection.  It was sent to pathology.  The wound was irrigated and hemostasis was obtained.  Local anesthetic was injected.  The wound was then closed in layers with deep tissues approximated with interrupted 3-0 Vicryl and the skin closed with running 4-0 Monocryl subcuticular.  Dermabond was placed.  All counts were correct.  There were no apparent complications.  She was taken recovery in stable condition. PATIENT DISPOSITION:  PACU - hemodynamically stable.   Delay start of Pharmacological VTE agent (>24hrs) due to surgical blood loss or risk of bleeding:  no  Georganna Skeans, MD, MPH, FACS Pager: [phone]  6/12/20239:36 AM

## 2022-05-28 NOTE — H&P (Signed)
REFERRING PHYSICIAN:  Valera Castle*   PROVIDER:  Leanora Ivanoff, MD   MRN: [id number] DOB: 03-28-1966 DATE OF ENCOUNTER: 05/09/2022 Subjective  Chief Complaint: New Consultation     History of Present Illness: Ebony Arellano is a 56 y.o. female who is seen today as an office consultation for evaluation of posterior neck mass. .     Dr. Garwin Brothers asked me to see her in consultation regarding a posterior neck mass.  She reports that she had a mass in this location removed about 9 years ago.  She is unsure if it was a lipoma or a cyst.  The area has grown back and it is bothering her.  She has not had any overlying skin changes.  It is getting larger.    Review of Systems: A complete review of systems was obtained from the patient.  I have reviewed this information and discussed as appropriate with the patient.  See HPI as well for other ROS.   ROS    Medical History: Past Medical History      Past Medical History:  Diagnosis Date   Hypertension     Thyroid disease          There is no problem list on file for this patient.     Past Surgical History       Past Surgical History:  Procedure Laterality Date   lipoma excision       MASTECTOMY PARTIAL / LUMPECTOMY            Allergies  No Known Allergies           Current Outpatient Medications on File Prior to Visit  Medication Sig Dispense Refill   chlorthalidone 25 MG tablet Take 12.5 mg by mouth once daily        No current facility-administered medications on file prior to visit.      Family History       Family History  Problem Relation Age of Onset   High blood pressure (Hypertension) Mother     Diabetes Mother     High blood pressure (Hypertension) Brother     Diabetes Brother          Social History       Tobacco Use  Smoking Status Never  Smokeless Tobacco Never      Social History  Social History        Socioeconomic History   Marital status: Unknown  Tobacco Use    Smoking status: Never   Smokeless tobacco: Never        Objective:       Vitals:    05/09/22 1134  BP: 122/78  Pulse: 104  SpO2: 97%  Weight: 78.9 kg (174 lb)  Height: 162.6 cm ('5\' 4"'$ )    Body mass index is 29.87 kg/m.   Physical Exam    General appearance - alert, well appearing, and in no distress Chest - clear to auscultation, no wheezes, rales or rhonchi, symmetric air entry Heart - normal rate, regular rhythm, normal S1, S2, no murmurs, rubs, clicks or gallops Abdomen - soft, nontender, nondistended, no masses or organomegaly Posterior neck has a 4 cm wide by 3 cm high soft subcutaneous mass.  It is not fixed to underlying structures.  No overlying skin changes.   Labs, Imaging and Diagnostic Testing: /   Assessment and Plan:     Diagnoses and all orders for this visit:   Neck mass  4 cm posterior neck mass.  This feels like a soft subcutaneous mass most consistent with a lipoma.  It may also be is sebaceous cyst.  It is bothering her and she would like it removed.  We will plan to schedule elective surgery to remove it.  I discussed the procedure, risks, and benefits in detail.  She is agreeable.   No follow-ups on file. Leanora Ivanoff, MD   Re-examined AM of surgery and no changes.  Georganna Skeans, MD, MPH, FACS Please use [email or web address] to contact on call provider

## 2022-05-28 NOTE — Anesthesia Preprocedure Evaluation (Signed)
Anesthesia Evaluation  Patient identified by MRN, date of birth, ID band Patient awake    Reviewed: Allergy & Precautions, NPO status , Patient's Chart, lab work & pertinent test results  History of Anesthesia Complications Negative for: history of anesthetic complications  Airway Mallampati: II  TM Distance: >3 FB Neck ROM: Full    Dental  (+) Teeth Intact, Dental Advisory Given   Pulmonary neg pulmonary ROS,    breath sounds clear to auscultation       Cardiovascular negative cardio ROS   Rhythm:Regular     Neuro/Psych negative neurological ROS  negative psych ROS   GI/Hepatic negative GI ROS, Neg liver ROS,   Endo/Other  negative endocrine ROS  Renal/GU negative Renal ROS     Musculoskeletal negative musculoskeletal ROS (+)   Abdominal   Peds  Hematology negative hematology ROS (+)   Anesthesia Other Findings   Reproductive/Obstetrics negative OB ROS No results found for: "PREGTESTUR", "PREGSERUM", "HCG", "HCGQUANT"                              Anesthesia Physical Anesthesia Plan  ASA: 1  Anesthesia Plan: General   Post-op Pain Management:    Induction: Intravenous  PONV Risk Score and Plan: 3 and Ondansetron and Dexamethasone  Airway Management Planned: Oral ETT  Additional Equipment: None  Intra-op Plan:   Post-operative Plan: Extubation in OR  Informed Consent: I have reviewed the patients History and Physical, chart, labs and discussed the procedure including the risks, benefits and alternatives for the proposed anesthesia with the patient or authorized representative who has indicated his/her understanding and acceptance.     Dental advisory given  Plan Discussed with: CRNA  Anesthesia Plan Comments:         Anesthesia Quick Evaluation

## 2022-05-28 NOTE — Transfer of Care (Signed)
Immediate Anesthesia Transfer of Care Note  Patient: Ebony Arellano  Procedure(s) Performed: EXCISION MASS POSTERIOR NECK  Patient Location: PACU  Anesthesia Type:General  Level of Consciousness: awake and drowsy  Airway & Oxygen Therapy: Patient Spontanous Breathing  Post-op Assessment: Report given to RN and Post -op Vital signs reviewed and stable  Post vital signs: Reviewed and stable  Last Vitals:  Vitals Value Taken Time  BP 155/94 05/28/22 0944  Temp    Pulse 125   Resp 13 05/28/22 0948  SpO2 93   Vitals shown include unvalidated device data.  Last Pain:  Vitals:   05/28/22 0710  TempSrc:   PainSc: 0-No pain         Complications: No notable events documented.

## 2022-05-28 NOTE — Anesthesia Procedure Notes (Signed)
Procedure Name: Intubation Date/Time: 05/28/2022 8:56 AM  Performed by: Dorann Lodge, CRNAPre-anesthesia Checklist: Patient identified, Emergency Drugs available, Suction available and Patient being monitored Patient Re-evaluated:Patient Re-evaluated prior to induction Oxygen Delivery Method: Circle System Utilized Preoxygenation: Pre-oxygenation with 100% oxygen Induction Type: IV induction Ventilation: Mask ventilation without difficulty Laryngoscope Size: Mac and 3 Grade View: Grade I Tube type: Oral Tube size: 7.0 mm Number of attempts: 1 Airway Equipment and Method: Stylet Placement Confirmation: ETT inserted through vocal cords under direct vision, positive ETCO2 and breath sounds checked- equal and bilateral Secured at: 22 cm Tube secured with: Tape Dental Injury: Teeth and Oropharynx as per pre-operative assessment

## 2022-05-29 ENCOUNTER — Encounter (HOSPITAL_COMMUNITY): Payer: Self-pay | Admitting: General Surgery

## 2022-05-29 LAB — SURGICAL PATHOLOGY

## 2022-05-29 NOTE — Anesthesia Postprocedure Evaluation (Signed)
Anesthesia Post Note  Patient: Ebony Arellano  Procedure(s) Performed: EXCISION MASS POSTERIOR NECK     Patient location during evaluation: PACU Anesthesia Type: General Level of consciousness: awake and alert Pain management: pain level controlled Vital Signs Assessment: post-procedure vital signs reviewed and stable Respiratory status: spontaneous breathing, nonlabored ventilation, respiratory function stable and patient connected to nasal cannula oxygen Cardiovascular status: blood pressure returned to baseline and stable Postop Assessment: no apparent nausea or vomiting Anesthetic complications: no   No notable events documented.  Last Vitals:  Vitals:   05/28/22 1000 05/28/22 1015  BP: 132/83 (!) 123/92  Pulse: 94 83  Resp: 15 16  Temp:  36.5 C  SpO2: 98% 98%    Last Pain:  Vitals:   05/28/22 1015  TempSrc:   PainSc: 0-No pain                 Ritta Hammes

## 2022-05-31 ENCOUNTER — Telehealth: Payer: Self-pay | Admitting: *Deleted

## 2022-05-31 ENCOUNTER — Other Ambulatory Visit: Payer: Self-pay | Admitting: Cardiovascular Disease

## 2022-05-31 DIAGNOSIS — G4733 Obstructive sleep apnea (adult) (pediatric): Secondary | ICD-10-CM

## 2022-05-31 NOTE — Telephone Encounter (Signed)
-----   Message from Troy Sine, MD sent at 05/24/2022  9:00 PM EDT ----- Ebony Arellano, please notify patient and arrange for CPAP titration.  If unable to be in lab, then AutoPap

## 2022-05-31 NOTE — Telephone Encounter (Signed)
Left message to return a cal to discuss HST results and recommendations.

## 2022-05-31 NOTE — Telephone Encounter (Signed)
Patient returned a call to me and was given her HST results and recommendations. After asking about alternative treatments she agrees to proceed with CPAP titration pending insurance approval. PA submitted to [facility].

## 2022-06-06 ENCOUNTER — Encounter (HOSPITAL_BASED_OUTPATIENT_CLINIC_OR_DEPARTMENT_OTHER): Payer: Self-pay | Admitting: Cardiovascular Disease

## 2022-06-06 DIAGNOSIS — E237 Disorder of pituitary gland, unspecified: Secondary | ICD-10-CM | POA: Insufficient documentation

## 2022-06-06 DIAGNOSIS — R0683 Snoring: Secondary | ICD-10-CM | POA: Insufficient documentation

## 2022-06-06 HISTORY — DX: Snoring: R06.83

## 2022-06-06 NOTE — Assessment & Plan Note (Signed)
She has a history of snoring and daytime somnolence..  Will refer for sleep study.

## 2022-06-06 NOTE — Assessment & Plan Note (Addendum)
History of galactorrhea.  Check prolactin level and ESR.

## 2022-06-06 NOTE — Assessment & Plan Note (Addendum)
Blood pressure has been above goal.  Recommend resuming chlorthalidone at 12.'5mg'$  daily.  Check CMP, TSH and lipids in a week.

## 2022-06-22 ENCOUNTER — Telehealth (HOSPITAL_BASED_OUTPATIENT_CLINIC_OR_DEPARTMENT_OTHER): Payer: Self-pay

## 2022-06-22 DIAGNOSIS — E7800 Pure hypercholesterolemia, unspecified: Secondary | ICD-10-CM

## 2022-06-22 NOTE — Telephone Encounter (Addendum)
Left message for patient to call back       ----- Message from Skeet Latch, MD sent at 06/22/2022  4:54 PM EDT ----- Thyroid function is normal.  Kidney and liver function are normal.  Cholesterol levels are very high.  Recommend exercising at least 150 minutes per week and limiting fried food, fatty food, red meat and cheese.  Recommend rosuvastatin '20mg'$  to limit cardiovascular risk.  Repeat lipids/CMP in 2-3 months.  Hemoglobin A1c is consistent with pre-diabetes.

## 2022-06-27 ENCOUNTER — Ambulatory Visit: Payer: 59

## 2022-07-03 ENCOUNTER — Ambulatory Visit: Payer: 59 | Admitting: Pharmacist Clinician (PhC)/ Clinical Pharmacy Specialist

## 2022-07-03 DIAGNOSIS — I10 Essential (primary) hypertension: Secondary | ICD-10-CM | POA: Diagnosis not present

## 2022-07-03 MED ORDER — ROSUVASTATIN CALCIUM 20 MG PO TABS: 20.0000 mg | ORAL_TABLET | Freq: Every day | ORAL | 3 refills | Status: DC

## 2022-07-03 NOTE — Addendum Note (Signed)
Addended by: Gerald Stabs on: 07/03/2022 01:53 PM   Modules accepted: Orders

## 2022-07-03 NOTE — Progress Notes (Signed)
07/03/2022 Ebony Arellano 06/02/1966 [id number]   HPI:  Ebony Arellano is a 56 y.o. female patient of Dr Oval Linsey, with a PMH below who presents today for hypertension clinic evaluation.  She was referred to the advanced hypertension clinic by Dr. Garwin Brothers, and at her first visit with Dr. Oval Linsey, had a BP of 132/90.  It was noted to be as high as 163/103 with Dr. Garwin Brothers.  She was started on chlorthalidone 12.5 mg daily and asked to return in 2 months.  Today she returns for follow up.  Has had no complaints or issues from starting the chlorthalidone.  She notes that her BP has been high in the past, but medication easily controlled it then.    Past Medical History: hyperlipidemia 5/23 LDL 150, started on rosuvastatin 20  Pre-diabetes 5/23 A1c 5.7 - encouraged to monitor diet, increase exercise     Blood Pressure Goal:  130/80  Current Medications: chlorthalidone 12.5 mg qd - am  Family Hx:  no history of father, mother doing well at 50;   Social Hx: no tobacco, rare alcohol, green tea, no other caffeine  Diet: uses [employer] food service - flash frozen meals, usually twice daily, mix of vegetables, fish, [place] and chicken; snacks consist of unsalted pretzels or nut mix   Exercise: pilates, walking  Home BP readings: some home readings, machine is arm, about > 22 years old  130/83 average - diastolic still all 70'Y  Intolerances: latex  Labs: 5/23:  Na 139, K 3.7, Glu 92, BUN 10, SCr 0.91, GFR 74   Wt Readings from Last 3 Encounters:  07/03/22 171 lb 15.3 oz (78 kg)  05/28/22 172 lb (78 kg)  05/15/22 172 lb (78 kg)   BP Readings from Last 3 Encounters:  07/03/22 127/77  05/28/22 (!) 123/92  04/27/22 132/90   Pulse Readings from Last 3 Encounters:  07/03/22 (!) 56  05/28/22 83  04/27/22 89    Current Outpatient Medications  Medication Sig Dispense Refill   chlorthalidone (HYGROTON) 25 MG tablet TAKE 1/2 TABLET DAILY 45 tablet 1   Coenzyme Q10 (COQ10  GUMMIES ADULT) 50 MG CHEW Chew 100 mg by mouth daily.     fexofenadine (ALLEGRA) 180 MG tablet Take 180 mg by mouth daily.     ketotifen ([employer] ALLERGY EYE DROPS) 0.025 % ophthalmic solution Place 1 drop into both eyes 2 (two) times daily as needed (allergies).     L-Lysine 500 MG CAPS Take 500 mg by mouth daily.     METAMUCIL FIBER PO Take 3 capsules by mouth in the morning, at noon, and at bedtime.     Omega-3 Fatty Acids (FISH OIL) 1000 MG CAPS Take 1,000 mg by mouth daily.     Probiotic Product (PROBIOTIC-10) CHEW Chew 2 each by mouth daily.     rosuvastatin (CRESTOR) 20 MG tablet Take 1 tablet (20 mg total) by mouth daily. 90 tablet 3   Turmeric Curcumin 500 MG CAPS Take 500 mg by mouth in the morning and at bedtime.     vitamin B-12 (CYANOCOBALAMIN) 1000 MCG tablet Take 1,000 mcg by mouth daily.     No current facility-administered medications for this visit.    Allergies  Allergen Reactions   Viteyes Smokers Formula-Lutein [Actical] Shortness Of Breath    Cigarette Smoke    Grass Pollen(K-O-R-T-Swt Vern) Itching, Other (See Comments) and Cough    Sneezing   Pollen Extract-Tree Extract [Pollen Extract] Itching, Other (See Comments) and  Cough    Sneezing   Cat Hair Extract Itching   Latex Itching and Swelling    Past Medical History:  Diagnosis Date   Seasonal allergies    Snoring 06/06/2022    Blood pressure 127/77, pulse (!) 56, height '5\' 5"'$  (1.651 m), weight 171 lb 15.3 oz (78 kg).  High blood pressure Patient with essential hypertension, now doing well on chlorthalidone 12.5 mg daily.   She is still getting mostly elevated diastolic readings at home (> 80), however her cuff is getting old.  She will look into getting an Omron arm cuff and continue to monitor pressure at home.  Advised that if BP continues to show diastolic elevations in the next couple of months, she should reach out to the office for further follow up.     Tommy Medal PharmD CPP [facility] [address] [place],  [address] [phone]

## 2022-07-03 NOTE — Patient Instructions (Signed)
  Check your blood pressure at home 3-4 times each week and keep record of the readings.  Look for Omron home BP machines.  Take your BP meds as follows:  Continue with chlorthalidone 12.5 mg daily.  Bring all of your meds, your BP cuff and your record of home blood pressures to your next appointment.  Exercise as you're able, try to walk approximately 30 minutes per day.  Keep salt intake to a minimum, especially watch canned and prepared boxed foods.  Eat more fresh fruits and vegetables and fewer canned items.  Avoid eating in fast food restaurants.    HOW TO TAKE YOUR BLOOD PRESSURE: Rest 5 minutes before taking your blood pressure.  Don't smoke or drink caffeinated beverages for at least 30 minutes before. Take your blood pressure before (not after) you eat. Sit comfortably with your back supported and both feet on the floor (don't cross your legs). Elevate your arm to heart level on a table or a desk. Use the proper sized cuff. It should fit smoothly and snugly around your bare upper arm. There should be enough room to slip a fingertip under the cuff. The bottom edge of the cuff should be 1 inch above the crease of the elbow. Ideally, take 3 measurements at one sitting and record the average.

## 2022-07-03 NOTE — Telephone Encounter (Signed)
Results called to patient who verbalizes understanding! Labs ordered and mailed, prescriptions updated and sent to pharmacy on file.   Patient requested referral for nutrition and prep. Orders placed.      ----- Message from Skeet Latch, MD sent at 06/22/2022  4:54 PM EDT ----- Thyroid function is normal.  Kidney and liver function are normal.  Cholesterol levels are very high.  Recommend exercising at least 150 minutes per week and limiting fried food, fatty food, red meat and cheese.  Recommend rosuvastatin '20mg'$  to limit cardiovascular risk.  Repeat lipids/CMP in 2-3 months.  Hemoglobin A1c is consistent with pre-diabetes.

## 2022-07-03 NOTE — Assessment & Plan Note (Signed)
Patient with essential hypertension, now doing well on chlorthalidone 12.5 mg daily.   She is still getting mostly elevated diastolic readings at home (> 80), however her cuff is getting old.  She will look into getting an Omron arm cuff and continue to monitor pressure at home.  Advised that if BP continues to show diastolic elevations in the next couple of months, she should reach out to the office for further follow up.

## 2022-07-05 ENCOUNTER — Telehealth: Payer: Self-pay

## 2022-07-05 NOTE — Telephone Encounter (Signed)
She returned my call, explained PREP she would like to attend but needs later afternoon/early evening class; will have Pam RN [facility] contact her about next evening class at [facility]

## 2022-07-05 NOTE — Telephone Encounter (Signed)
Called re: PREP program referral, left voicemail  

## 2022-07-16 ENCOUNTER — Ambulatory Visit (HOSPITAL_BASED_OUTPATIENT_CLINIC_OR_DEPARTMENT_OTHER): Payer: 59 | Admitting: Cardiovascular Disease

## 2022-07-20 ENCOUNTER — Ambulatory Visit (HOSPITAL_BASED_OUTPATIENT_CLINIC_OR_DEPARTMENT_OTHER): Payer: 59

## 2022-07-30 ENCOUNTER — Other Ambulatory Visit: Payer: Self-pay | Admitting: Endocrinology

## 2022-07-30 DIAGNOSIS — N951 Menopausal and female climacteric states: Secondary | ICD-10-CM

## 2022-08-02 ENCOUNTER — Other Ambulatory Visit: Payer: Self-pay | Admitting: Endocrinology

## 2022-08-02 DIAGNOSIS — D352 Benign neoplasm of pituitary gland: Secondary | ICD-10-CM

## 2022-08-18 ENCOUNTER — Ambulatory Visit
Admission: RE | Admit: 2022-08-18 | Discharge: 2022-08-18 | Disposition: A | Discharge status: Home / Self Care | Payer: 59 | Source: Ambulatory Visit | Attending: Endocrinology | Admitting: Endocrinology

## 2022-08-18 DIAGNOSIS — D352 Benign neoplasm of pituitary gland: Secondary | ICD-10-CM

## 2022-08-18 MED ORDER — GADOPICLENOL 0.5 MMOL/ML IV SOLN
4.0000 mL | Freq: Once | INTRAVENOUS | Status: AC | PRN
  Administered 2022-08-18: 4 mL via INTRAVENOUS

## 2022-08-28 ENCOUNTER — Ambulatory Visit
Admission: RE | Admit: 2022-08-28 | Discharge: 2022-08-28 | Disposition: A | Discharge status: Home / Self Care | Payer: 59 | Source: Ambulatory Visit | Attending: Endocrinology | Admitting: Endocrinology

## 2022-08-28 DIAGNOSIS — N951 Menopausal and female climacteric states: Secondary | ICD-10-CM

## 2022-09-11 ENCOUNTER — Encounter: Payer: Self-pay | Admitting: Family Medicine

## 2022-09-11 ENCOUNTER — Ambulatory Visit: Payer: 59 | Admitting: Family Medicine

## 2022-09-11 VITALS — BP 136/82 | HR 64 | Temp 98.2°F | Ht 63.25 in | Wt 172.0 lb

## 2022-09-11 DIAGNOSIS — I10 Essential (primary) hypertension: Secondary | ICD-10-CM | POA: Diagnosis not present

## 2022-09-11 DIAGNOSIS — Z7689 Persons encountering health services in other specified circumstances: Secondary | ICD-10-CM | POA: Diagnosis not present

## 2022-09-11 DIAGNOSIS — D352 Benign neoplasm of pituitary gland: Secondary | ICD-10-CM

## 2022-09-11 DIAGNOSIS — E782 Mixed hyperlipidemia: Secondary | ICD-10-CM

## 2022-09-11 NOTE — Patient Instructions (Signed)
It was nice meeting you!

## 2022-09-11 NOTE — Progress Notes (Signed)
Patient presents to clinic today to establish care.  SUBJECTIVE: PMH: Is a 56 year old female previously seen by Donald Prose.  Patient followed by Dr. Garwin Brothers, OB/GYN, Dr. Oval Linsey, cardiology, Dr. Collene Mares, GI.  HTN: -was on medication prior to moving to the area.   -States previous provider would not refill med/wanted to wean pt off medication -Patient was consistent with lifestyle changes but noticed elevation in BP. -Eventually restarted on chlorthalidone 12.5 mg daily -Seen by cardiology, Dr. Skeet Latch.  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 Michiel Sites, endocrinology  Elevated eye pressure: -Patient seen by ophthalmology -Patient inquires if pituitary adenoma could be contributing to this.  Weight: -Patient endorses 13 pound loss since the beginning of the year. -Working on diet and exercise. -Doing Pilates -Less sidewalks makes it harder walking-  Allergies: NKDA Meds  Past surgical history: Lipoma removal right neck Tech implant right breast status postbiopsy 2022 Lumpectomy right breast  Social history: Pt moved from [place] to the area during the pandemic.  Patient currently working at [employer] center.  Endorses very rare alcohol use.  Denies tobacco or drug use.  Health Maintenance: Dental -- Vision -- Immunizations -- Colonoscopy -- Mammogram -- 04/24/22 PAP --  Bone Density --    Past Medical History:  Diagnosis Date   Seasonal allergies    Snoring 06/06/2022    Past Surgical History:  Procedure Laterality Date   BREAST LUMPECTOMY Right    MASS EXCISION N/A 05/28/2022   Procedure: EXCISION MASS POSTERIOR NECK;  Surgeon: Georganna Skeans, MD;  Location: [place];  Service: General;  Laterality: N/A;    Current Outpatient Medications on  File Prior to Visit  Medication Sig Dispense Refill   chlorthalidone (HYGROTON) 25 MG tablet TAKE 1/2 TABLET DAILY 45 tablet 1   Coenzyme Q10 (COQ10 GUMMIES ADULT) 50 MG CHEW Chew 100 mg by mouth daily.     fexofenadine (ALLEGRA) 180 MG tablet Take 180 mg by mouth daily.     ketotifen ([employer]S ALLERGY EYE DROPS) 0.025 % ophthalmic solution Place 1 drop into both eyes 2 (two) times daily as needed (allergies).     L-Lysine 500 MG CAPS Take 500 mg by mouth daily.     METAMUCIL FIBER PO Take 3 capsules by mouth in the morning, at noon, and at bedtime.     Omega-3 Fatty Acids (FISH OIL) 1000 MG CAPS Take 1,000 mg by mouth daily.     rosuvastatin (CRESTOR) 20 MG tablet Take 1 tablet (20 mg total) by mouth daily. 90 tablet 3   valACYclovir (VALTREX) 500 MG tablet Take by mouth as needed.     vitamin B-12 (CYANOCOBALAMIN) 1000 MCG tablet Take 1,000 mcg by mouth daily.     Probiotic Product (PROBIOTIC-10) CHEW Chew 2 each by mouth daily.     Turmeric Curcumin 500 MG CAPS Take 500 mg by mouth in the morning and at bedtime.     No current facility-administered medications on file prior to visit.    Allergies  Allergen Reactions   Viteyes Smokers Formula-Lutein [Actical] Shortness Of Breath    Cigarette Smoke    Grass Pollen(K-O-R-T-Swt Vern) Itching, Other (See Comments) and Cough    Sneezing   Pollen Extract-Tree Extract [Pollen Extract] Itching, Other (See Comments) and Cough    Sneezing   Cat Hair Extract Itching  Latex Itching and Swelling    Family History  Problem Relation Age of Onset   Hypertension Mother    Asthma Sister    Hypertension Brother    Diabetes Brother    Diabetes Brother     Social History   Socioeconomic History   Marital status: Single    Spouse name: Not on file   Number of children: Not on file   Years of education: Not on file   Highest education level: Not on file  Occupational History   Not on file  Tobacco Use   Smoking status: Never   Smokeless  tobacco: Never  Substance and Sexual Activity   Alcohol use: Yes    Comment: Occassionally   Drug use: Never   Sexual activity: Not on file  Other Topics Concern   Not on file  Social History Narrative   Not on file   Social Determinants of Health   Financial Resource Strain: Not on file  Food Insecurity: Not on file  Transportation Needs: Not on file  Physical Activity: Not on file  Stress: Not on file  Social Connections: Not on file  Intimate Partner Violence: Not on file    ROS General: Denies fever, chills, night sweats, changes in appetite  + changes in weight HEENT: Denies headaches, ear pain, changes in vision, rhinorrhea, sore throat   +eye pressure CV: Denies CP, palpitations, SOB, orthopnea Pulm: Denies SOB, cough, wheezing GI: Denies abdominal pain, nausea, vomiting, diarrhea, constipation GU: Denies dysuria, hematuria, frequency, vaginal discharge Msk: Denies muscle cramps, joint pains Neuro: Denies weakness, numbness, tingling Skin: Denies rashes, bruising Psych: Denies depression, anxiety, hallucinations   BP 136/82 (BP Location: Left Arm, Patient Position: Sitting, Cuff Size: Large)   Pulse 64   Temp 98.2 F (36.8 C) (Oral)   Ht 5' 3.25" (1.607 m)   Wt 172 lb (78 kg)   SpO2 98%   BMI 30.23 kg/m   Physical Exam Gen. Pleasant, well developed, well-nourished, in NAD HEENT - [place]/AT, PERRL, EOMI, conjunctive clear, no scleral icterus, no nasal drainage Lungs: no use of accessory muscles, CTAB, no wheezes, rales or rhonchi Cardiovascular: RRR,  No r/g/m, no peripheral edema Abdomen: BS present, soft, nontender, nondistended Musculoskeletal: No deformities, moves all four extremities, no cyanosis or clubbing, normal tone Neuro:  A&Ox3, CN II-XII intact, normal gait Skin:  Warm, dry, intact, no lesions   No results found for this or any previous visit (from the past 2160 hour(s)).  Assessment/Plan: Essential  hypertension -Elevated -Recheck -Continue chlorthalidone 12.5 mg daily -Continue lifestyle modifications -Patient encouraged to check BP at home and keep a log to bring with her to clinic -Continue follow-up with cardiology.  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 -Continue follow-up with endocrinology  Encounter to establish care -We reviewed the PMH, PSH, FH, SH, Meds and Allergies. -We provided refills for any medications we will prescribe as needed. -We addressed current concerns per orders and patient instructions. -We have asked for records for pertinent exams, studies, vaccines and notes from previous providers. -We have advised patient to follow up per instructions below.  F/u as needed  Grier Mitts, MD

## 2022-09-21 ENCOUNTER — Encounter: Payer: Self-pay | Admitting: Family Medicine

## 2022-10-01 ENCOUNTER — Other Ambulatory Visit (HOSPITAL_BASED_OUTPATIENT_CLINIC_OR_DEPARTMENT_OTHER): Payer: Self-pay | Admitting: Cardiovascular Disease

## 2022-10-02 NOTE — Telephone Encounter (Signed)
Rx request sent to pharmacy.  

## 2022-10-05 ENCOUNTER — Encounter: Payer: 59 | Admitting: Family Medicine

## 2022-11-05 ENCOUNTER — Encounter (HOSPITAL_BASED_OUTPATIENT_CLINIC_OR_DEPARTMENT_OTHER): Payer: Self-pay | Admitting: Cardiovascular Disease

## 2022-11-05 ENCOUNTER — Ambulatory Visit (HOSPITAL_BASED_OUTPATIENT_CLINIC_OR_DEPARTMENT_OTHER): Payer: 59 | Admitting: Cardiovascular Disease

## 2022-11-05 VITALS — BP 110/76 | HR 88 | Ht 63.25 in | Wt 170.4 lb

## 2022-11-05 DIAGNOSIS — E7800 Pure hypercholesterolemia, unspecified: Secondary | ICD-10-CM

## 2022-11-05 DIAGNOSIS — I10 Essential (primary) hypertension: Secondary | ICD-10-CM | POA: Diagnosis not present

## 2022-11-05 HISTORY — DX: Pure hypercholesterolemia, unspecified: E78.00

## 2022-11-05 NOTE — Assessment & Plan Note (Signed)
Blood pressure is very well-controlled.  She is doing wonderful with diet and exercise.  Continue chlorthalidone.  Check CMP.

## 2022-11-05 NOTE — Progress Notes (Signed)
Advanced [facility]pertension Clinic Follow Up:    Date:  11/05/2022   ID:  Ebony Arellano, DOB September 09, 1966, MRN [id number]  PCP:  Billie Ruddy, MD  Cardiologist:  Skeet Latch, MD   Referring MD: Donald Prose, MD   CC: Hypertension  History of Present Illness:    Ebony Arellano is a 56 y.o. female with a hx of hypertension and pituitary adenoma here for follow up.  She first established care in the [facility] 04/2022. Her BP was 163/103 with Dr. Garwin Brothers on 02/2022.  She was not on any medication at the time.  Prior to her visit with Dr. Garwin Brothers her BP was running high. She was previously on low doses of nebivolol and chlorthalidone.  Someone recommended that she wean her BP meds off.  At her initial visit in our office her blood pressure was 132/90. She restarted Chlorthalidone and was referred for a sleep study, which showed moderate OSA and a CPAP was recommended. She followed up with our pharmacist and her blood pressure was well-controlled. She was referred to the prep program at the [facility]  Today, she has been doing well overall. She denies experiencing any chest pain or dyspnea. Her blood pressures at home averages around 416-606 systolic and 30-16 diastolic. She noticed that her weight has possibly gone down since she can fit in certain clothes that she wasn't able to. She has been consistent with physical activity which consists of cardio pilates 4x a week and feels well upon activity. She noted that she couldn't attend the PREP program at the [facility] due to her work schedule. In regards to her dietary habits, she is doing well and does a meal subscription once a week and has been cautious with her sodium intake.    Past Medical History:  Diagnosis Date   Essential hypertension 10/27/1992   Pure hypercholesterolemia 11/05/2022   Seasonal allergies    Snoring 06/06/2022    Past Surgical History:  Procedure Laterality Date   BREAST LUMPECTOMY Right    MASS EXCISION  N/A 05/28/2022   Procedure: EXCISION MASS POSTERIOR NECK;  Surgeon: Georganna Skeans, MD;  Location: [facility];  Service: General;  Laterality: N/A;    Current Medications: Current Meds  Medication Sig   chlorthalidone (HYGROTON) 25 MG tablet TAKE 1/2 TABLET BY MOUTH EVERY DAY   Coenzyme Q10 (COQ10 GUMMIES ADULT) 50 MG CHEW Chew 100 mg by mouth daily.   fexofenadine (ALLEGRA) 180 MG tablet Take 180 mg by mouth daily.   ketotifen ([employer] ALLERGY EYE DROPS) 0.025 % ophthalmic solution Place 1 drop into both eyes 2 (two) times daily as needed (allergies).   METAMUCIL FIBER PO Take 3 capsules by mouth in the morning, at noon, and at bedtime.   Omega-3 Fatty Acids (FISH OIL) 1000 MG CAPS Take 1,000 mg by mouth daily.   rosuvastatin (CRESTOR) 20 MG tablet Take 1 tablet (20 mg total) by mouth daily.   valACYclovir (VALTREX) 500 MG tablet Take by mouth as needed.   vitamin B-12 (CYANOCOBALAMIN) 1000 MCG tablet Take 1,000 mcg by mouth daily.     Allergies:   Viteyes smokers formula-lutein [actical], Grass pollen(k-o-r-t-swt vern), Pollen extract-tree extract [pollen extract], Cat hair extract, and Latex   Social History   Socioeconomic History   Marital status: Single    Spouse name: Not on file   Number of children: Not on file   Years of education: Not on file   Highest education level: Not on file  Occupational History   Not on file  Tobacco Use   Smoking status: Never   Smokeless tobacco: Never  Substance and Sexual Activity   Alcohol use: Yes    Comment: Occassionally   Drug use: Never   Sexual activity: Not on file  Other Topics Concern   Not on file  Social History Narrative   Not on file   Social Determinants of Health   Financial Resource Strain: Not on file  Food Insecurity: Not on file  Transportation Needs: Not on file  Physical Activity: Not on file  Stress: Not on file  Social Connections: Not on file     Family History: The patient's family history includes Asthma  in her mother and sister; Diabetes in her brother, brother, and mother; Early death in her father and maternal grandmother; Hypertension in her brother and mother.  ROS:   Please see the history of present illness.     All other systems reviewed and are negative.  EKGs/Labs/Other Studies Reviewed:    EKG:   11/05/22: Sinus rhythm. Rate 64 bpm. Non-specific t wave abnormalities 04/27/22:sinus rhythm.  Rate 89 bopm.  Nonspecific T wave abnormalities.  Recent Labs: 05/15/2022: ALT 20; BUN 10; Creatinine, Ser 0.91; Potassium 3.7; Sodium 139; TSH 1.690   Recent Lipid Panel    Component Value Date/Time   CHOL 238 (H) 05/15/2022 1330   TRIG 132 05/15/2022 1330   HDL 65 05/15/2022 1330   CHOLHDL 3.7 05/15/2022 1330   LDLCALC 150 (H) 05/15/2022 1330    Physical Exam:    VS:  BP 110/76 (BP Location: Left Arm, Patient Position: Sitting, Cuff Size: Large)   Pulse 88   Ht 5' 3.25" (1.607 m)   Wt 170 lb 6.4 oz (77.3 kg)   BMI 29.95 kg/m  , BMI Body mass index is 29.95 kg/m. GENERAL:  Well appearing HEENT: Pupils equal round and reactive, fundi not visualized, oral mucosa unremarkable NECK:  No jugular venous distention, waveform within normal limits, carotid upstroke brisk and symmetric, no bruits, no thyromegaly LUNGS:  Clear to auscultation bilaterally HEART:  RRR.  PMI not displaced or sustained,S1 and S2 within normal limits, no S3, no S4, no clicks, no rubs, no murmurs ABD:  Flat, positive bowel sounds normal in frequency in pitch, no bruits, no rebound, no guarding, no midline pulsatile mass, no hepatomegaly, no splenomegaly EXT:  2 plus pulses throughout, no edema, no cyanosis no clubbing SKIN:  No rashes no nodules NEURO:  Cranial nerves II through XII grossly intact, motor grossly intact throughout PSYCH:  Cognitively intact, oriented to person place and time   ASSESSMENT/PLAN:    Essential hypertension Blood pressure is very well-controlled.  She is doing wonderful with  diet and exercise.  Continue chlorthalidone.  Check CMP.  Pure hypercholesterolemia She started on rosuvastatin 06/2022.  Will repeat lipids/CMP today.    Screening for Secondary Hypertension:     04/27/2022   12:18 PM  Causes  Drugs/Herbals Screened     - Comments struggles with sodium.  rare caffeine.  Very rare EtOH    Relevant Labs/Studies:    Latest Ref Rng & Units 05/15/2022    1:30 PM  Basic Labs  Sodium 134 - 144 mmol/L 139   Potassium 3.5 - 5.2 mmol/L 3.7   Creatinine 0.57 - 1.00 mg/dL 0.91        Latest Ref Rng & Units 05/15/2022    1:30 PM  Thyroid   TSH 0.450 - 4.500 uIU/mL 1.690  Disposition:    FU with MD/PharmD in 2 months.  Aidon Klemens C. Oval Linsey, MD, [facility] in 6 months.    Medication Adjustments/Labs and Tests Ordered: Current medicines are reviewed at length with the patient today.  Concerns regarding medicines are outlined above.  Orders Placed This Encounter  Procedures   Lipid panel   Comprehensive metabolic panel   EKG 01-THYH   No orders of the defined types were placed in this encounter.   I,Danny Valdes,acting as a Education administrator for [place], MD.,have documented all relevant documentation on the behalf of Skeet Latch, MD,as directed by  Skeet Latch, MD while in the presence of Skeet Latch, MD.  I, [place] Oval Linsey, MD have reviewed all documentation for this visit.  The documentation of the exam, diagnosis, procedures, and orders on 11/05/2022 are all accurate and complete.   Signed, Skeet Latch, MD  11/05/2022 10:01 AM    [place]

## 2022-11-05 NOTE — Assessment & Plan Note (Signed)
She started on rosuvastatin 06/2022.  Will repeat lipids/CMP today.

## 2022-11-05 NOTE — Patient Instructions (Signed)
Medication Instructions:  Your physician recommends that you continue on your current medications as directed. Please refer to the Current Medication list given to you today.   *If you need a refill on your cardiac medications before your next appointment, please call your pharmacy*  Lab Work: LP/CMET TODAY   If you have labs (blood work) drawn today and your tests are completely normal, you will receive your results only by: [place] (if you have MyChart) OR A paper copy in the mail If you have any lab test that is abnormal or we need to change your treatment, we will call you to review the results.  Testing/Procedures: NONE   Follow-Up: At [facility], you and your health needs are our priority.  As part of our continuing mission to provide you with exceptional heart care, we have created designated Provider Care Teams.  These Care Teams include your primary Cardiologist (physician) and Advanced Practice Providers (APPs -  Physician Assistants and Nurse Practitioners) who all work together to provide you with the care you need, when you need it.  We recommend signing up for the patient portal called "MyChart".  Sign up information is provided on this After Visit Summary.  MyChart is used to connect with patients for Virtual Visits (Telemedicine).  Patients are able to view lab/test results, encounter notes, upcoming appointments, etc.  Non-urgent messages can be sent to your provider as well.   To learn more about what you can do with MyChart, go to [email or web address].    Your next appointment:   12 month(s)  The format for your next appointment:   In Person  Provider:   Skeet Latch, MD

## 2022-11-06 LAB — LIPID PANEL
Chol/HDL Ratio: 2.2 ratio (ref 0.0–4.4)
Cholesterol, Total: 136 mg/dL (ref 100–199)
HDL: 61 mg/dL (ref >39)
LDL Chol Calc (NIH): 57 mg/dL (ref 0–99)
Triglycerides: 94 mg/dL (ref 0–149)
VLDL Cholesterol Cal: 18 mg/dL (ref 5–40)

## 2022-11-06 LAB — COMPREHENSIVE METABOLIC PANEL
ALT: 37 IU/L — ABNORMAL HIGH (ref 0–32)
AST: 26 IU/L (ref 0–40)
Albumin/Globulin Ratio: 1.8 (ref 1.2–2.2)
Albumin: 4.8 g/dL (ref 3.8–4.9)
Alkaline Phosphatase: 74 IU/L (ref 44–121)
BUN/Creatinine Ratio: 17 (ref 9–23)
BUN: 15 mg/dL (ref 6–24)
Bilirubin Total: 0.7 mg/dL (ref 0.0–1.2)
CO2: 23 mmol/L (ref 20–29)
Calcium: 9.5 mg/dL (ref 8.7–10.2)
Chloride: 103 mmol/L (ref 96–106)
Creatinine, Ser: 0.86 mg/dL (ref 0.57–1.00)
Globulin, Total: 2.6 g/dL (ref 1.5–4.5)
Glucose: 101 mg/dL — ABNORMAL HIGH (ref 70–99)
Potassium: 3.6 mmol/L (ref 3.5–5.2)
Sodium: 143 mmol/L (ref 134–144)
Total Protein: 7.4 g/dL (ref 6.0–8.5)
eGFR: 79 mL/min/{1.73_m2} (ref >59)

## 2022-11-07 ENCOUNTER — Other Ambulatory Visit (HOSPITAL_BASED_OUTPATIENT_CLINIC_OR_DEPARTMENT_OTHER): Payer: Self-pay | Admitting: *Deleted

## 2022-11-07 DIAGNOSIS — R7989 Other specified abnormal findings of blood chemistry: Secondary | ICD-10-CM

## 2023-02-11 ENCOUNTER — Ambulatory Visit: Payer: 59 | Admitting: Family Medicine

## 2023-02-11 ENCOUNTER — Ambulatory Visit (HOSPITAL_BASED_OUTPATIENT_CLINIC_OR_DEPARTMENT_OTHER)
Admission: RE | Admit: 2023-02-11 | Discharge: 2023-02-11 | Disposition: A | Discharge status: Home / Self Care | Payer: 59 | Source: Ambulatory Visit | Attending: Family Medicine | Admitting: Family Medicine

## 2023-02-11 ENCOUNTER — Encounter: Payer: Self-pay | Admitting: Family Medicine

## 2023-02-11 VITALS — BP 142/90 | HR 84 | Temp 98.9°F | Ht 63.0 in | Wt 170.0 lb

## 2023-02-11 DIAGNOSIS — R051 Acute cough: Secondary | ICD-10-CM

## 2023-02-11 DIAGNOSIS — I10 Essential (primary) hypertension: Secondary | ICD-10-CM | POA: Diagnosis not present

## 2023-02-11 NOTE — Patient Instructions (Addendum)
The order for your x-ray was placed.  You can proceed to [facility] to have it done.  Their address is [address], [place], [place] [address].  [phone].  Continue supportive care with over-the-counter cough and cold medications for symptoms.

## 2023-02-11 NOTE — Progress Notes (Signed)
   Established Patient Office Visit   Subjective  Patient ID: Ebony Arellano, female    DOB: Aug 27, 1966  Age: 57 y.o. MRN: [id number]  Chief Complaint  Patient presents with   Cough    Patient complains of cough, Non productive, Tried Coricidin with little relief     Pt is a 57 yo female with pmg sig for HTN, pituitary microadenoma, allergies, HLD who is seen for acute concern.  Patient endorses intermittent cough for weeks.  Most recent episode started 6 days ago, last Tuesday with dry cough.  Dry cough turned into productive cough over the weekend.  Patient denies other symptoms including fever, chills, headache, ear pain/pressure, rhinorrhea, nasal congestion, postnasal drainage, sore throat, nausea, vomiting, diarrhea.    Patient notes allergy flare earlier today at work when a coworker who has a cat got too close to her.  Patient states she had to leave the room for relief.  Cough      Review of Systems  Respiratory:  Positive for cough.    Negative unless stated above    Objective:     BP (!) 142/90 (BP Location: Left Arm, Patient Position: Sitting, Cuff Size: Normal)   Pulse 84   Temp 98.9 F (37.2 C) (Oral)   Ht 5' 3"$  (1.6 m)   Wt 170 lb (77.1 kg)   SpO2 100%   BMI 30.11 kg/m    Physical Exam Constitutional:      General: She is not in acute distress.    Appearance: Normal appearance.  HENT:     Head: Normocephalic and atraumatic.     Right Ear: Tympanic membrane normal.     Left Ear: Tympanic membrane normal.     Nose: Nose normal.     Mouth/Throat:     Mouth: Mucous membranes are moist.  Eyes:     Extraocular Movements: Extraocular movements intact.     Conjunctiva/sclera: Conjunctivae normal.     Pupils: Pupils are equal, round, and reactive to light.  Cardiovascular:     Rate and Rhythm: Normal rate and regular rhythm.     Heart sounds: Normal heart sounds. No murmur heard.    No gallop.  Pulmonary:     Effort: Pulmonary effort is normal. No  respiratory distress.     Breath sounds: Normal breath sounds. No wheezing, rhonchi or rales.     Comments: Decreased bs in LL base. Skin:    General: Skin is warm and dry.  Neurological:     Mental Status: She is alert and oriented to person, place, and time.      No results found for any visits on 02/11/23.    Assessment & Plan:  Acute cough -Discussed possible causes of symptoms including pneumonia, bronchitis, other viral etiology, postnasal drainage -Will obtain CXR to evaluate slightly decreased breath sounds in left lower bases heard on exam. -Continue OTC cough/cold medications.  Expectant management. -Further recommendations based on results. -given precautions -     DG Chest 2 View; Future  Essential hypertension -Elevated -Possibly 2/2 coughing -Continue current medication including chlorthalidone 12.5 mg daily. -For consistent elevation increase chlorthalidone.   Return if symptoms worsen or fail to improve.   Billie Ruddy, MD

## 2023-04-05 ENCOUNTER — Other Ambulatory Visit (HOSPITAL_BASED_OUTPATIENT_CLINIC_OR_DEPARTMENT_OTHER): Payer: Self-pay | Admitting: Cardiovascular Disease

## 2023-04-05 NOTE — Telephone Encounter (Signed)
Rx(s) sent to pharmacy electronically.  

## 2023-04-17 ENCOUNTER — Other Ambulatory Visit (HOSPITAL_BASED_OUTPATIENT_CLINIC_OR_DEPARTMENT_OTHER): Payer: Self-pay | Admitting: Cardiovascular Disease

## 2023-04-17 NOTE — Telephone Encounter (Signed)
Rx request sent to pharmacy.  

## 2023-04-29 ENCOUNTER — Encounter: Payer: Self-pay | Admitting: Family Medicine

## 2023-04-29 ENCOUNTER — Ambulatory Visit: Payer: 59 | Admitting: Family Medicine

## 2023-04-29 VITALS — BP 132/86 | HR 86 | Temp 98.3°F | Wt 170.8 lb

## 2023-04-29 DIAGNOSIS — R0602 Shortness of breath: Secondary | ICD-10-CM

## 2023-04-29 DIAGNOSIS — H65193 Other acute nonsuppurative otitis media, bilateral: Secondary | ICD-10-CM

## 2023-04-29 DIAGNOSIS — J069 Acute upper respiratory infection, unspecified: Secondary | ICD-10-CM | POA: Diagnosis not present

## 2023-04-29 DIAGNOSIS — R0982 Postnasal drip: Secondary | ICD-10-CM

## 2023-04-29 LAB — POC COVID19 BINAXNOW: SARS Coronavirus 2 Ag: NEGATIVE

## 2023-04-29 MED ORDER — AMOXICILLIN-POT CLAVULANATE 500-125 MG PO TABS: 1.0000 | ORAL_TABLET | Freq: Two times a day (BID) | ORAL | 0 refills | Status: AC
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
## 2023-05-30 ENCOUNTER — Institutional Professional Consult (permissible substitution): Payer: 59 | Admitting: Pulmonary Disease

## 2023-05-30 IMAGING — US US BREAST*R* LIMITED INC AXILLA
1 series · 5 of 5 positions shown · non-contrast
Comparison: Previous exam(s).

CLINICAL DATA: 54-year-old female presenting for follow-up of a
discordant right breast biopsy for which excision was recommended
but short-term follow-up was elected instead.

EXAM:
DIGITAL DIAGNOSTIC UNILATERAL RIGHT MAMMOGRAM WITH TOMOSYNTHESIS AND
CAD; ULTRASOUND RIGHT BREAST LIMITED
TECHNIQUE: Right digital diagnostic mammography and breast tomosynthesis was
performed. The images were evaluated with computer-aided detection.;
Targeted ultrasound examination of the right breast was performed

[Series 1: us breast*right* limited inc axilla · 0.07mm/px · 5 of 5 slices shown]
[im 1/5]
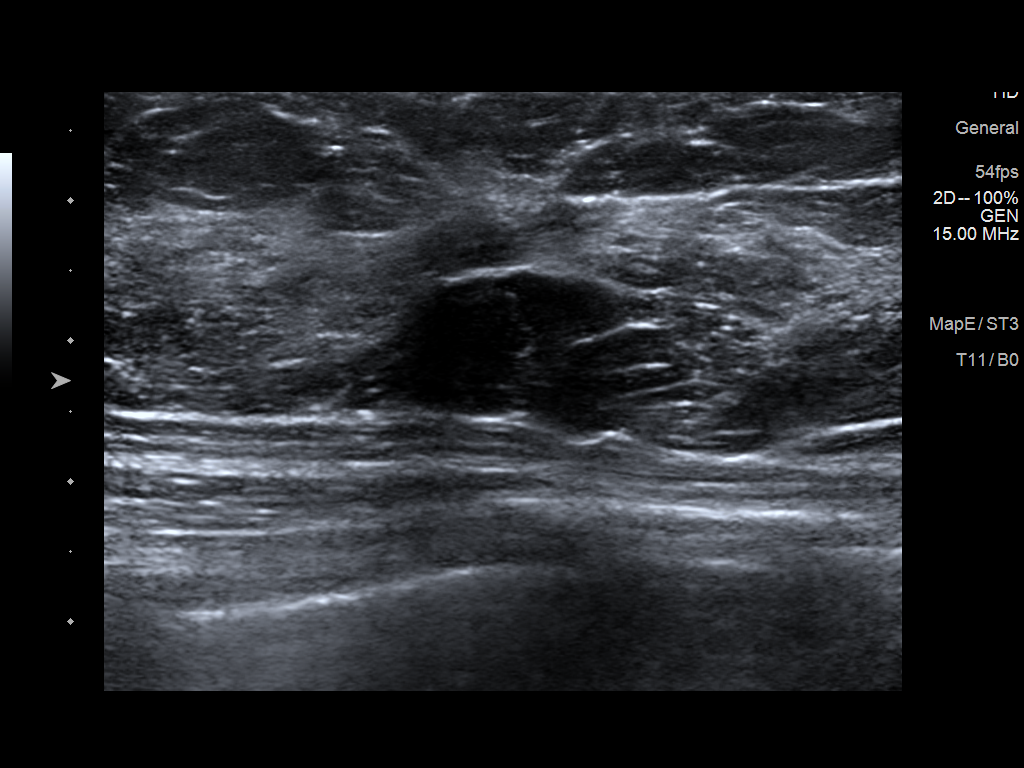
[im 2/5]
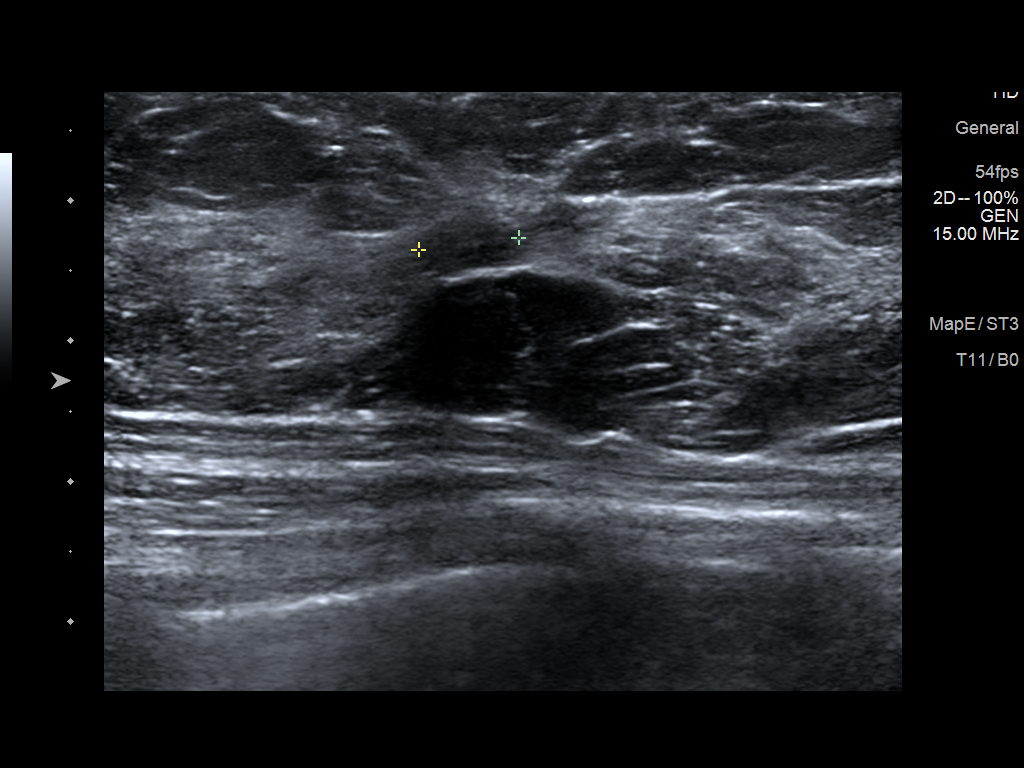
[im 3/5]
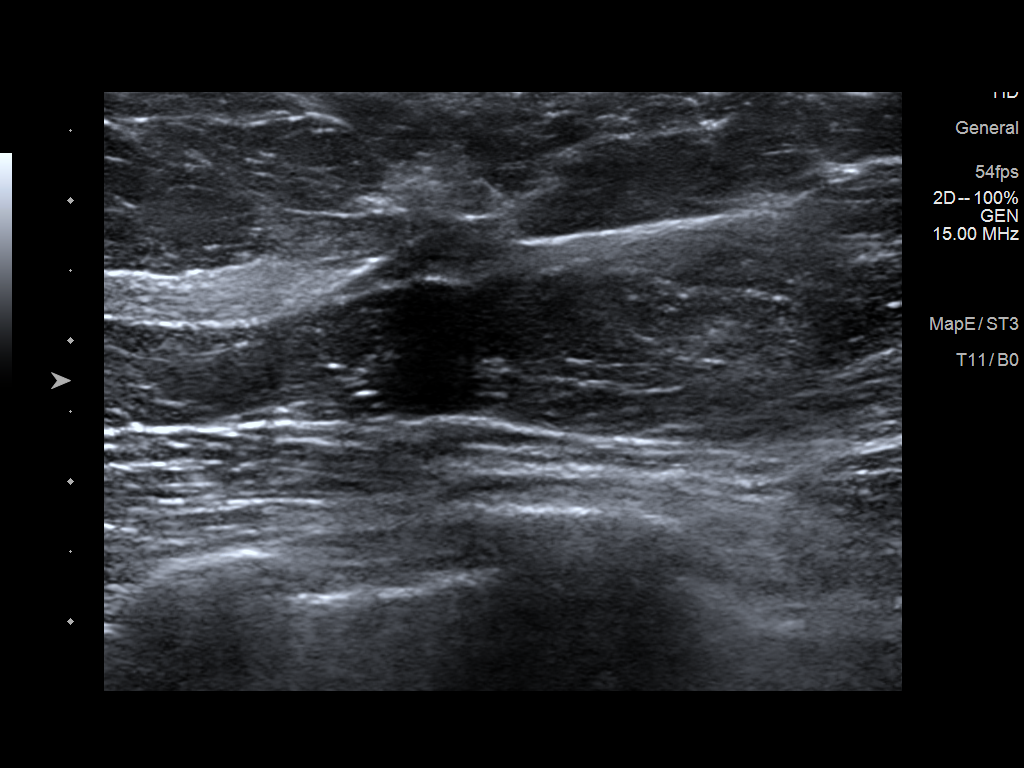
[im 4/5]
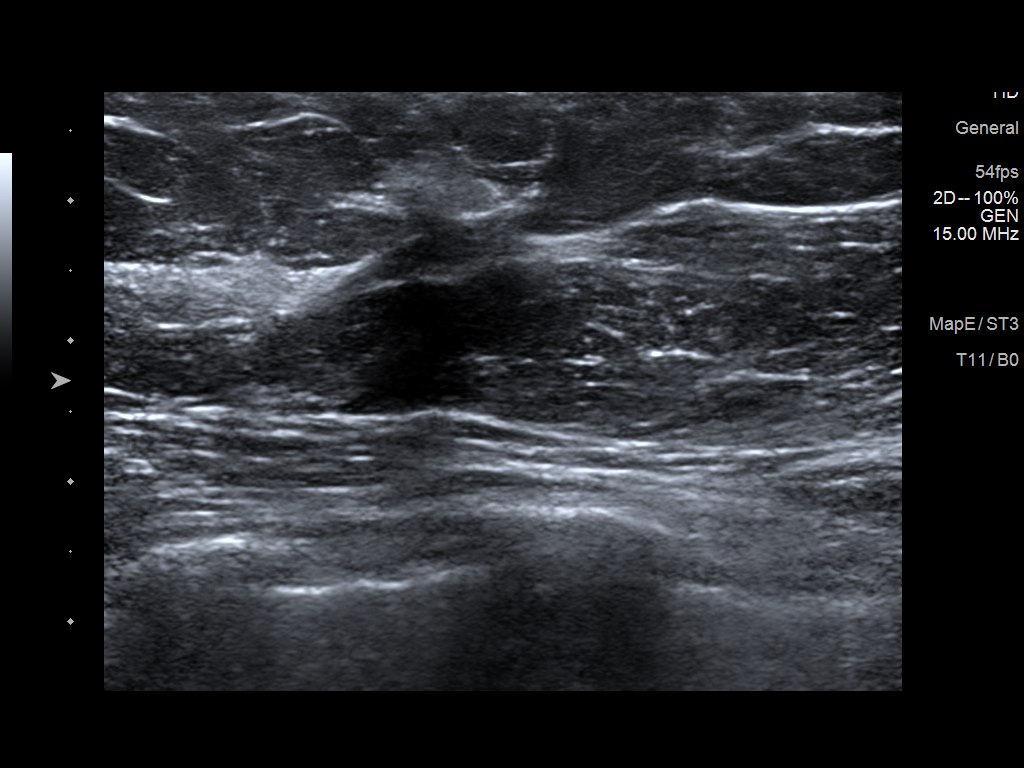
[im 5/5]
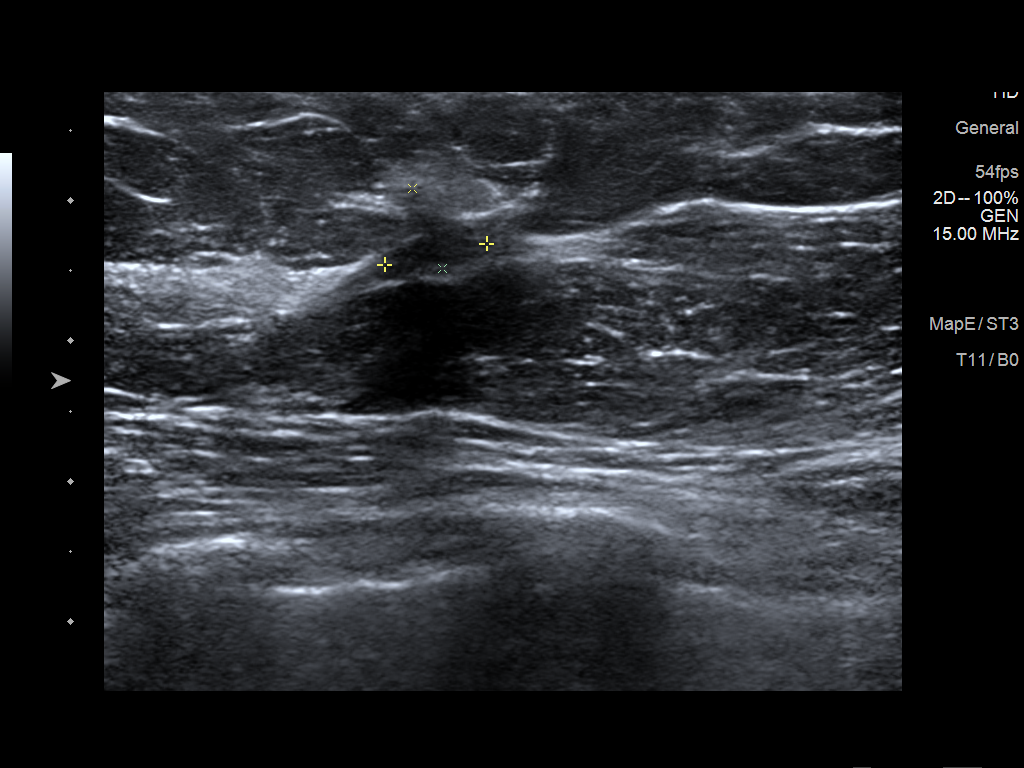

[5 of 5 positions shown; findings below may reference images not displayed]

ACR Breast Density Category c: The breast tissue is heterogeneously
dense, which may obscure small masses.
FINDINGS: Mammogram:

There is a biopsy marking clip in the upper outer right breast at
the site of the discordant biopsy. There is persistent architectural
distortion 6 which does not appear significantly changed compared to
the prior mammogram. There are no new suspicious findings elsewhere
in the right breast.

Ultrasound:

Targeted ultrasound performed in the right breast at 9:30 o'clock 3
cm from the nipple demonstrating an irregular hypoechoic mass with
distortion measuring 0.7 x 0.6 x 0.7 cm, previously measuring 0.7 x
0.6 x 0.6 cm.
IMPRESSION: Stable suspicious mass with distortion in the right breast at 9:30
o'clock.

RECOMMENDATION:
Surgical excision is again recommended for the right breast mass
with distortion, however if patient and surgeon elect to continue
surveillance, recommend diagnostic bilateral mammogram in February 2022.

I have discussed the findings and recommendations with the patient.
If applicable, a reminder letter will be sent to the patient
regarding the next appointment.

BI-RADS CATEGORY  4: Suspicious.

## 2023-05-30 IMAGING — MG MM DIGITAL DIAGNOSTIC UNILAT*R* W/ TOMO W/ CAD
4 series · 4 of 12 positions shown · non-contrast
Comparison: Previous exam(s).

CLINICAL DATA: 54-year-old female presenting for follow-up of a
discordant right breast biopsy for which excision was recommended
but short-term follow-up was elected instead.

EXAM:
DIGITAL DIAGNOSTIC UNILATERAL RIGHT MAMMOGRAM WITH TOMOSYNTHESIS AND
CAD; ULTRASOUND RIGHT BREAST LIMITED
TECHNIQUE: Right digital diagnostic mammography and breast tomosynthesis was
performed. The images were evaluated with computer-aided detection.;
Targeted ultrasound examination of the right breast was performed

[R CC synth-2D]
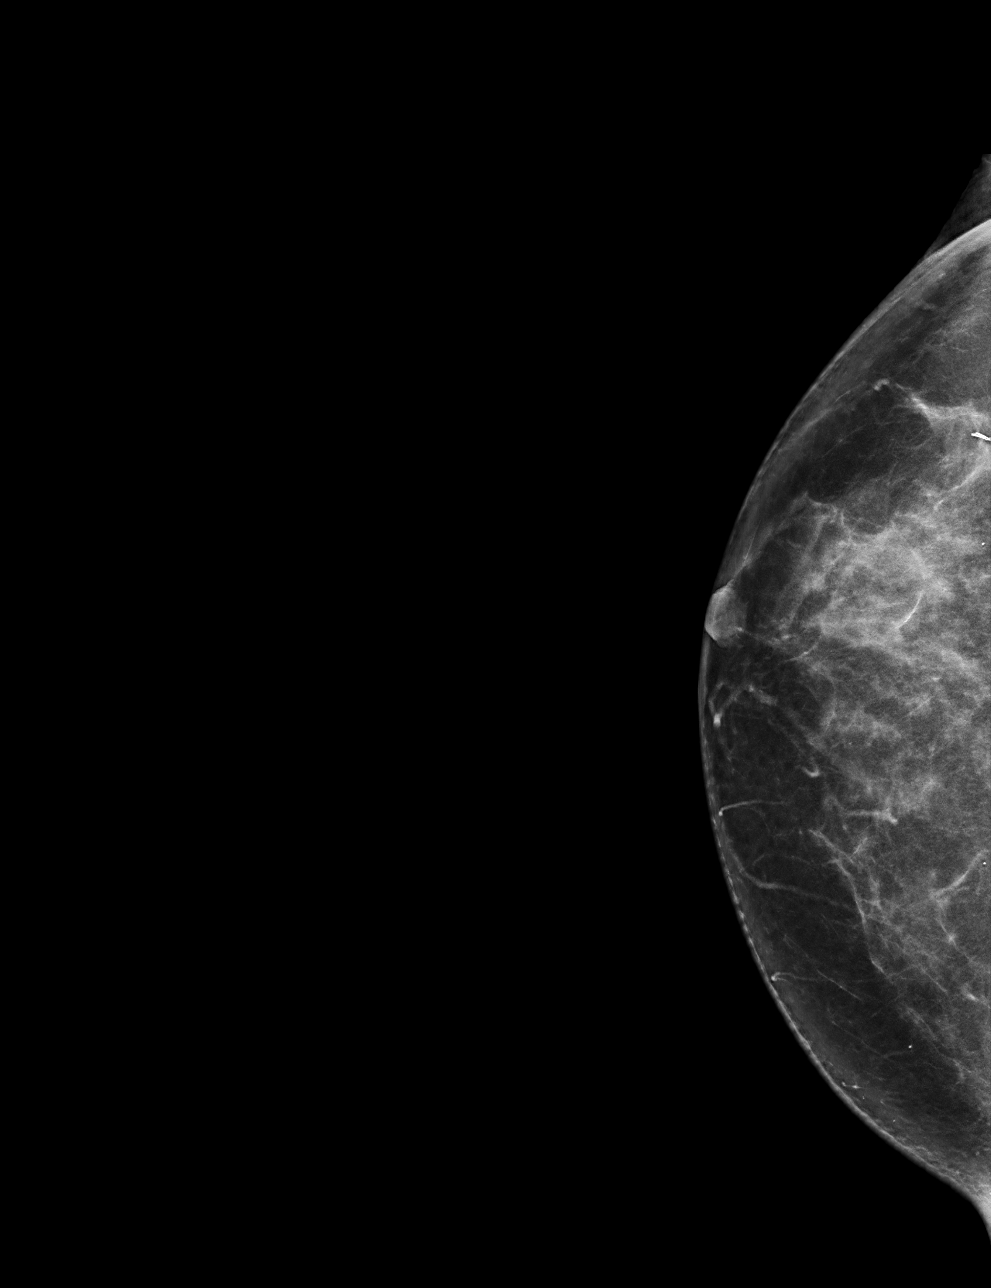

[R MLO synth-2D]
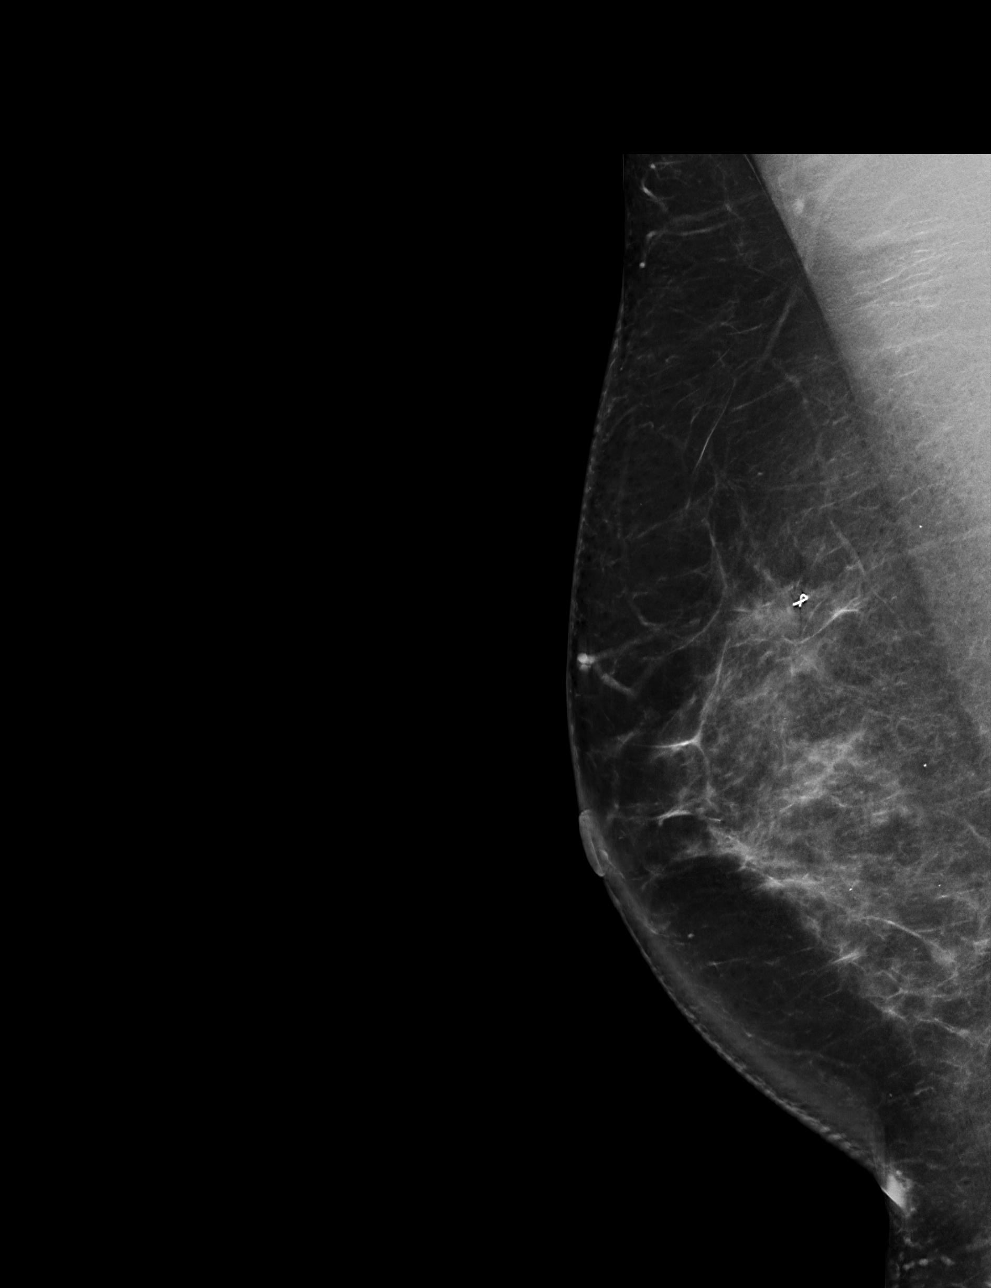

[R MLO tomo · tomo slice 43/85.0]
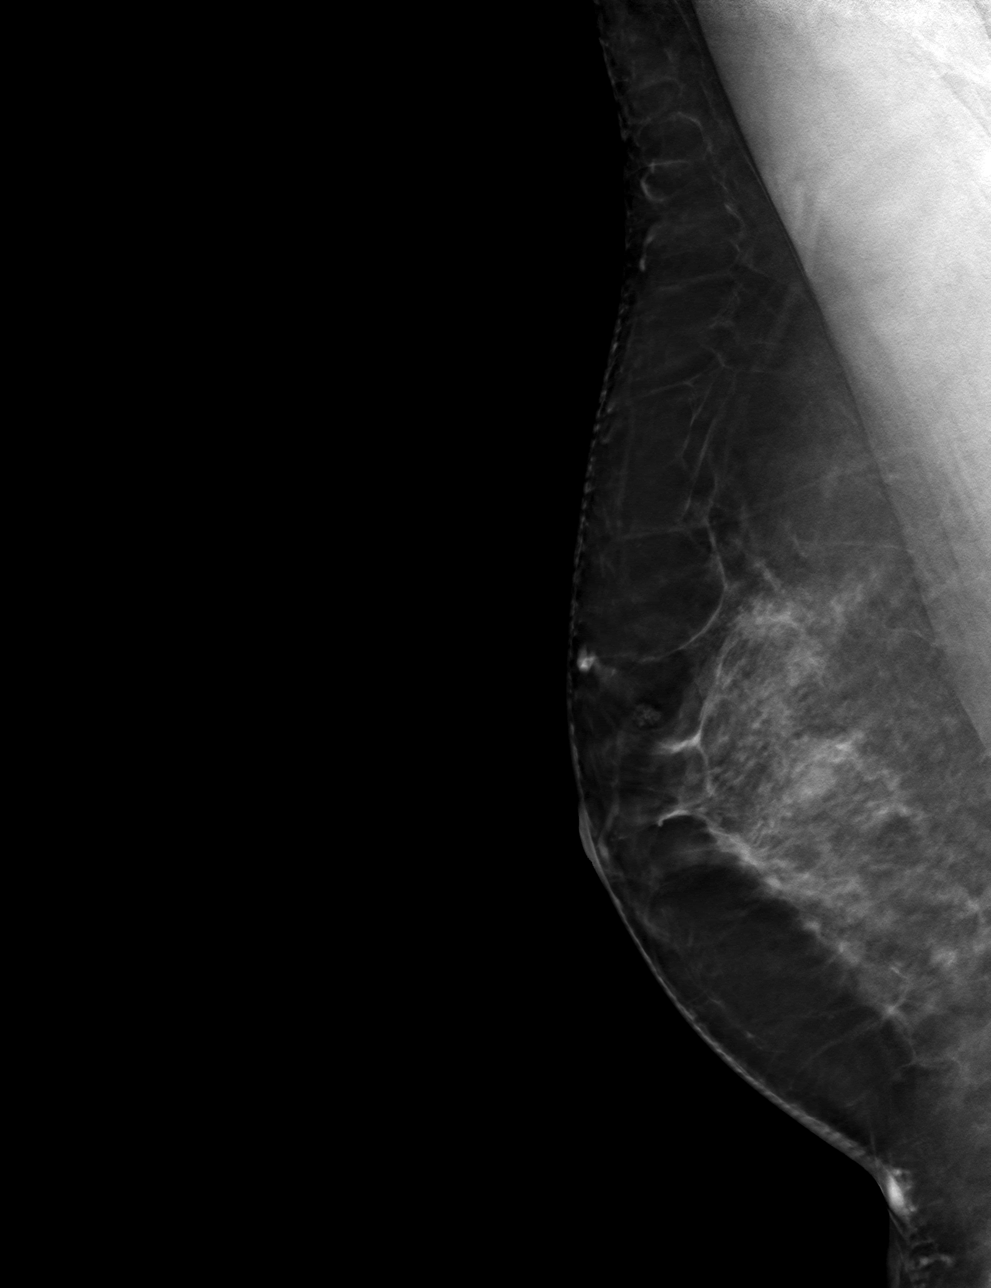

[R CC tomo · tomo slice 39/78.0]
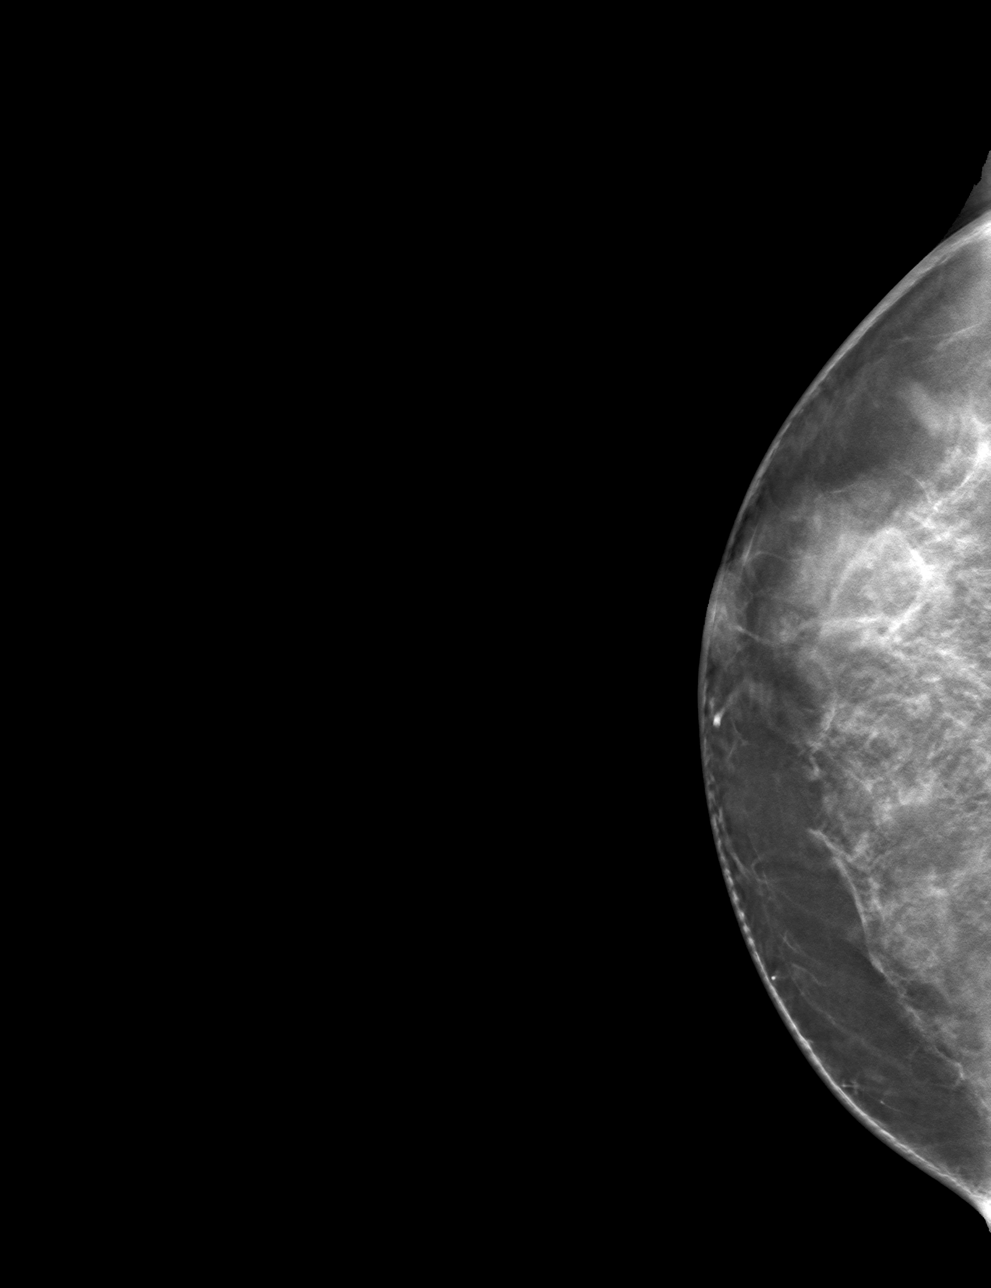

[4 of 12 positions shown; findings below may reference images not displayed]

ACR Breast Density Category c: The breast tissue is heterogeneously
dense, which may obscure small masses.
FINDINGS: Mammogram:

There is a biopsy marking clip in the upper outer right breast at
the site of the discordant biopsy. There is persistent architectural
distortion 6 which does not appear significantly changed compared to
the prior mammogram. There are no new suspicious findings elsewhere
in the right breast.

Ultrasound:

Targeted ultrasound performed in the right breast at 9:30 o'clock 3
cm from the nipple demonstrating an irregular hypoechoic mass with
distortion measuring 0.7 x 0.6 x 0.7 cm, previously measuring 0.7 x
0.6 x 0.6 cm.
IMPRESSION: Stable suspicious mass with distortion in the right breast at 9:30
o'clock.

RECOMMENDATION:
Surgical excision is again recommended for the right breast mass
with distortion, however if patient and surgeon elect to continue
surveillance, recommend diagnostic bilateral mammogram in February 2022.

I have discussed the findings and recommendations with the patient.
If applicable, a reminder letter will be sent to the patient
regarding the next appointment.

BI-RADS CATEGORY  4: Suspicious.

## 2023-07-08 ENCOUNTER — Other Ambulatory Visit (HOSPITAL_BASED_OUTPATIENT_CLINIC_OR_DEPARTMENT_OTHER): Payer: Self-pay | Admitting: Cardiovascular Disease

## 2023-07-08 DIAGNOSIS — B009 Herpesviral infection, unspecified: Secondary | ICD-10-CM

## 2023-07-15 ENCOUNTER — Ambulatory Visit: Payer: 59 | Admitting: Pulmonary Disease

## 2023-07-15 ENCOUNTER — Encounter: Payer: Self-pay | Admitting: Pulmonary Disease

## 2023-07-15 VITALS — BP 132/80 | HR 76 | Ht 63.0 in | Wt 166.0 lb

## 2023-07-15 DIAGNOSIS — J4520 Mild intermittent asthma, uncomplicated: Secondary | ICD-10-CM

## 2023-07-15 DIAGNOSIS — J310 Chronic rhinitis: Secondary | ICD-10-CM | POA: Diagnosis not present

## 2023-07-15 DIAGNOSIS — G4733 Obstructive sleep apnea (adult) (pediatric): Secondary | ICD-10-CM

## 2023-07-15 MED ORDER — FLUTICASONE PROPIONATE 50 MCG/ACT NA SUSP
1.0000 | Freq: Every day | NASAL | 3 refills | Status: DC
Start: 2023-07-15 — End: 2024-09-07

## 2023-07-15 MED ORDER — FLUTICASONE FUROATE-VILANTEROL 100-25 MCG/ACT IN AEPB
1.0000 | INHALATION_SPRAY | Freq: Every day | RESPIRATORY_TRACT | 2 refills | Status: DC
Start: 2023-07-15 — End: 2023-07-19

## 2023-07-15 NOTE — Patient Instructions (Addendum)
Start breo ellipta 1 puff daily and monitor for improvement in your shortness of breath - rinse mouth out after each use thoroughly  We will order you a CPAP machine with humidification along with supplies and a facemask fitting session for a nasal pillow mask - call us to schedule a video visit 1 month after you have been on CPAP for a compliance review  Use fluticasone nasal spray, 1 spray per nostril daily  Follow up in 4 months with pulmonary function tests

## 2023-07-15 NOTE — Progress Notes (Signed)
Synopsis: Referred in July 2024 for shortness of breath  Subjective:   PATIENT ID: Ebony Arellano GENDER: female DOB: [birth year], MRN: [id number]  HPI  Chief Complaint  Patient presents with   Consult    Referred by PCP for increased SOB. States the SOB occurs with different situations such as odor exposures.    Ebony Arellano is a 57 year old woman, never smoker with history of hypertension and seasonal allergies who was referred to pulmonary clinic for shortness of breath.  She reports having shortness of breath since child hood. No dx of childhood asthma. She reports more frequent episodes over recent weeks. She is having episodes 2-3 times every other week depending on her exposures at work.  She reports increased symptoms when exposed to strong perfumes or strong scents from hairsprays, cat dander and seasonal allergies in the fall and spring.  She denies any issues with cold air.  She denies any nighttime awakenings.  She has not not used an inhaler in the past.  She has sinus congestion with postnasal drainage along with intermittent sinus pressure.  She is a never smoker she does report secondhand smoke in childhood.  She grew up in [place] and worked/lived in [place] until she retired and moved to [place] recently.  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  Past Medical History:  Diagnosis Date   Essential hypertension 10/27/1992   Pure hypercholesterolemia 11/05/2022   Seasonal allergies    Snoring 06/06/2022     Family History  Problem Relation Age of Onset   Diabetes Mother    Asthma Mother    Hypertension Mother    Early death Father    Asthma Sister    Hypertension Brother    Diabetes Brother    Diabetes Brother    Early death Maternal Grandmother      Social History   Socioeconomic History   Marital status: Single    Spouse name: Not on file   Number of children: Not on file   Years of education: Not  on file   Highest education level: Professional school degree (e.g., MD, DDS, DVM, JD)  Occupational History   Not on file  Tobacco Use   Smoking status: Never   Smokeless tobacco: Never  Substance and Sexual Activity   Alcohol use: Yes    Comment: Occassionally   Drug use: Never   Sexual activity: Not on file  Other Topics Concern   Not on file  Social History Narrative   Not on file   Social Determinants of Health   Financial Resource Strain: Low Risk  (02/10/2023)   Overall Financial Resource Strain (CARDIA)    Difficulty of Paying Living Expenses: Not hard at all  Food Insecurity: No Food Insecurity (02/10/2023)   Hunger Vital Sign    Worried About Running Out of Food in the Last Year: Never true    Ran Out of Food in the Last Year: Never true  Transportation Needs: No Transportation Needs (02/10/2023)   PRAPARE - Administrator, Civil Service (Medical): No    Lack of Transportation (Non-Medical): No  Physical Activity: Insufficiently Active (02/10/2023)   Exercise Vital Sign    Days of Exercise per Week: 1 day    Minutes of Exercise per Session: 10 min  Stress: Stress Concern Present (02/10/2023)   [employer] of Occupational Health - Occupational Stress Questionnaire    Feeling of Stress : To some extent  Social  Connections: Unknown (02/10/2023)   Social Connection and Isolation Panel [NHANES]    Frequency of Communication with Friends and Family: Once a week    Frequency of Social Gatherings with Friends and Family: Not on file    Attends Religious Services: More than 4 times per year    Active Member of [employer] or Organizations: No    Attends Engineer, structural: Not on file    Marital Status: Never married  Catering manager Violence: Not on file     Allergies  Allergen Reactions   Viteyes Smokers Formula-Lutein [Actical] Shortness Of Breath    Cigarette Smoke    Grass Pollen(K-O-R-T-Swt Vern) Itching, Other (See Comments) and Cough     Sneezing   Pollen Extract-Tree Extract [Pollen Extract] Itching, Other (See Comments) and Cough    Sneezing   Cat Hair Extract Itching   Latex Itching and Swelling     Outpatient Medications Prior to Visit  Medication Sig Dispense Refill   chlorthalidone (HYGROTON) 25 MG tablet TAKE 1/2 TABLET BY MOUTH DAILY 45 tablet 1   fexofenadine (ALLEGRA) 180 MG tablet Take 180 mg by mouth daily.     ketotifen ([employer] ALLERGY EYE DROPS) 0.025 % ophthalmic solution Place 1 drop into both eyes 2 (two) times daily as needed (allergies).     METAMUCIL FIBER PO Take 3 capsules by mouth in the morning, at noon, and at bedtime.     Omega-3 Fatty Acids (FISH OIL) 1000 MG CAPS Take 1,000 mg by mouth daily.     rosuvastatin (CRESTOR) 20 MG tablet TAKE 1 TABLET BY MOUTH EVERY DAY 90 tablet 1   valACYclovir (VALTREX) 500 MG tablet Take by mouth as needed.     No facility-administered medications prior to visit.    Review of Systems  Constitutional:  Negative for chills, fever, malaise/fatigue and weight loss.  HENT:  Positive for congestion. Negative for sinus pain and sore throat.   Eyes: Negative.   Respiratory:  Positive for cough, shortness of breath and wheezing. Negative for hemoptysis and sputum production.   Cardiovascular:  Negative for chest pain, palpitations, orthopnea, claudication and leg swelling.  Gastrointestinal:  Negative for abdominal pain, heartburn, nausea and vomiting.  Genitourinary: Negative.   Musculoskeletal:  Negative for joint pain and myalgias.  Skin:  Negative for rash.  Neurological:  Negative for weakness.  Endo/Heme/Allergies:  Positive for environmental allergies.  Psychiatric/Behavioral: Negative.     Objective:   Vitals:   07/15/23 1417  BP: 132/80  Pulse: 76  SpO2: 97%  Weight: 166 lb (75.3 kg)  Height: 5\' 3"  (1.6 m)   Physical Exam Constitutional:      General: She is not in acute distress.    Appearance: Normal appearance.  Eyes:     General: No scleral  icterus.    Conjunctiva/sclera: Conjunctivae normal.  Cardiovascular:     Rate and Rhythm: Normal rate and regular rhythm.  Pulmonary:     Breath sounds: No wheezing, rhonchi or rales.  Musculoskeletal:     Right lower leg: No edema.     Left lower leg: No edema.  Skin:    General: Skin is warm and dry.  Neurological:     General: No focal deficit present.     CBC No results found for: "WBC", "RBC", "HGB", "HCT", "PLT", "MCV", "MCH", "MCHC", "RDW", "LYMPHSABS", "MONOABS", "EOSABS", "BASOSABS"   Chest imaging:  PFT:     No data to display  Labs:  Path:  Echo:  Heart Catheterization:    Assessment & Plan:   Mild intermittent reactive airway disease without complication - Plan: Pulmonary Function Test, DISCONTINUED: fluticasone furoate-vilanterol (BREO ELLIPTA) 100-25 MCG/ACT AEPB  Rhinitis, unspecified type - Plan: fluticasone (FLONASE) 50 MCG/ACT nasal spray  Moderate obstructive sleep apnea - Plan: Ambulatory Referral for DME  Discussion: Ebony Arellano is a 57 year old woman, never smoker with history of hypertension and seasonal allergies who was referred to pulmonary clinic for shortness of breath.  She appears to have mild intermittent reactive airways disease, allergic rhinitis with postnasal drainage.  She is to start Breo Ellipta 1 puff daily and monitor for improvement in her shortness of breath.  She is to use fluticasone nasal spray 1 spray per nostril daily for nasal congestion and drainage.  She has moderate obstructive sleep apnea based on sleep study from last year.  We will order her CPAP machine with humidification along with supplies and a f mask fitting session for nasal pillow mask.  She is to call us and schedule a 1 month follow-up for CPAP compliance review after she starts her CPAP treatment.  Follow-up in 4 months with pulmonary function test.  Melody Comas, MD [facility] Office:  [phone]   Current Outpatient Medications:    chlorthalidone (HYGROTON) 25 MG tablet, TAKE 1/2 TABLET BY MOUTH DAILY, Disp: 45 tablet, Rfl: 1   fexofenadine (ALLEGRA) 180 MG tablet, Take 180 mg by mouth daily., Disp: , Rfl:    fluticasone (FLONASE) 50 MCG/ACT nasal spray, Place 1 spray into both nostrils daily., Disp: 16 g, Rfl: 3   ketotifen (CVS ALLERGY EYE DROPS) 0.025 % ophthalmic solution, Place 1 drop into both eyes 2 (two) times daily as needed (allergies)., Disp: , Rfl:    METAMUCIL FIBER PO, Take 3 capsules by mouth in the morning, at noon, and at bedtime., Disp: , Rfl:    Omega-3 Fatty Acids (FISH OIL) 1000 MG CAPS, Take 1,000 mg by mouth daily., Disp: , Rfl:    rosuvastatin (CRESTOR) 20 MG tablet, TAKE 1 TABLET BY MOUTH EVERY DAY, Disp: 90 tablet, Rfl: 1   valACYclovir (VALTREX) 500 MG tablet, Take by mouth as needed., Disp: , Rfl:    fluticasone-salmeterol (WIXELA INHUB) 250-50 MCG/ACT AEPB, Inhale 1 puff into the lungs in the morning and at bedtime., Disp: 60 each, Rfl: 5

## 2023-07-16 ENCOUNTER — Telehealth: Payer: Self-pay | Admitting: Pulmonary Disease

## 2023-07-16 DIAGNOSIS — J4520 Mild intermittent asthma, uncomplicated: Secondary | ICD-10-CM

## 2023-07-16 NOTE — Telephone Encounter (Signed)
Patient called to ask the nurse to call regarding the cost of an inhaler that the doctor prescribed.  She stated that the doctor told her if it was more than $10 when she picked it up, she should call the office so that she could possibly get an alternative that is not as expensive.  Please advise and call patient to discuss at [phone]

## 2023-07-17 NOTE — Telephone Encounter (Signed)
Patient states Ebony Arellano script is too expensive. Can you please run ticket on patients insurance to see what else is covered?  Route message back to triage?

## 2023-07-18 ENCOUNTER — Other Ambulatory Visit (HOSPITAL_COMMUNITY): Payer: Self-pay

## 2023-07-18 NOTE — Telephone Encounter (Signed)
Generic Advair Diskus is showing as a $10.00 co-pay at this time.

## 2023-07-19 MED ORDER — FLUTICASONE-SALMETEROL 250-50 MCG/ACT IN AEPB
1.0000 | INHALATION_SPRAY | Freq: Two times a day (BID) | RESPIRATORY_TRACT | 5 refills | Status: DC
Start: 2023-07-19 — End: 2023-11-15

## 2023-07-19 NOTE — Telephone Encounter (Signed)
ATC X1 LVM for patient Please advise Monte Fantasia has been sent to pharmacy

## 2023-07-19 NOTE — Telephone Encounter (Signed)
Wixella 250-66mcg 1 puff twice daily sent to pharmacy.  Thanks, JD

## 2023-07-21 ENCOUNTER — Encounter: Payer: Self-pay | Admitting: Pulmonary Disease

## 2023-07-25 ENCOUNTER — Encounter: Payer: Self-pay | Admitting: Pulmonary Disease

## 2023-07-25 ENCOUNTER — Telehealth: Payer: Self-pay | Admitting: Pulmonary Disease

## 2023-07-25 NOTE — Telephone Encounter (Signed)
She was not started on CPAP after the sleep study as she was told her insurance did not cover CPAP therapy at the time.   OV note has been updated to reflect this.   Thanks, JD

## 2023-07-29 NOTE — Telephone Encounter (Signed)
Note faxed.

## 2023-08-05 ENCOUNTER — Other Ambulatory Visit: Payer: Self-pay

## 2023-08-14 ENCOUNTER — Telehealth: Payer: Self-pay | Admitting: Family Medicine

## 2023-08-14 NOTE — Telephone Encounter (Signed)
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 Saw [facility] a couple years ago.  Pt also needs the following refilled: valACYclovir (VALTREX) 500 MG tablet   Pt advise pt ASAP.

## 2023-08-15 NOTE — Telephone Encounter (Signed)
If patient has been seen by the ophthalmologist recently she likely does not need a referral.  She can always contact their office to see if she would be considered a new patient.  Okay to refill Valtrex however patient never mentioned at establish care visit.  Would be helpful to know if having cold sores or other HSV outbreak.

## 2023-08-23 ENCOUNTER — Other Ambulatory Visit: Payer: Self-pay | Admitting: Family Medicine

## 2023-08-23 DIAGNOSIS — Z8619 Personal history of other infectious and parasitic diseases: Secondary | ICD-10-CM

## 2023-08-23 MED ORDER — VALACYCLOVIR HCL 500 MG PO TABS
500.0000 mg | ORAL_TABLET | ORAL | 1 refills | Status: DC | PRN
Start: 2023-08-23 — End: 2023-11-29

## 2023-08-23 NOTE — Telephone Encounter (Signed)
Rx sent to pharmacy   

## 2023-10-02 ENCOUNTER — Other Ambulatory Visit (HOSPITAL_BASED_OUTPATIENT_CLINIC_OR_DEPARTMENT_OTHER): Payer: Self-pay | Admitting: Cardiovascular Disease

## 2023-10-28 ENCOUNTER — Ambulatory Visit: Payer: 59 | Admitting: Family Medicine

## 2023-10-28 VITALS — BP 120/84 | HR 81 | Temp 99.0°F | Ht 63.0 in | Wt 172.8 lb

## 2023-10-28 DIAGNOSIS — S83001A Unspecified subluxation of right patella, initial encounter: Secondary | ICD-10-CM | POA: Diagnosis not present

## 2023-10-28 DIAGNOSIS — M25561 Pain in right knee: Secondary | ICD-10-CM | POA: Diagnosis not present

## 2023-10-28 DIAGNOSIS — M67969 Unspecified disorder of synovium and tendon, unspecified lower leg: Secondary | ICD-10-CM

## 2023-10-28 DIAGNOSIS — L821 Other seborrheic keratosis: Secondary | ICD-10-CM | POA: Diagnosis not present

## 2023-10-28 NOTE — Progress Notes (Signed)
Established Patient Office Visit   Subjective  Patient ID: Ebony Arellano, female    DOB: 03-13-1966  Age: 57 y.o. MRN: [id number]  Chief Complaint  Patient presents with   Knee Pain    Patient is a 57 year old female seen for acute concern.  Patient with right knee pain x 1 week.  Patient was walking when symptoms started.  Denies injury.  States feels like the kneecap is moving around.  In the past patient had knee pain with going up and down steps.  States pain is sharp, shooting, 2/10.  Patient notes increased walking due to visiting her mother who is in the hospital with stage IV cancer over the last 3 weeks.  Patient tried knee sleeve, elevation, Tylenol, and ice.  Patient mentions a mole that may have changed on her lower abdomen.  Border may be irregular.  States it is not sore, does not bleed.  At times gets irritated by clothing.  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   Snoring 06/06/2022   Essential hypertension 10/27/1992   Past Medical History:  Diagnosis Date   Essential hypertension 10/27/1992   Pure hypercholesterolemia 11/05/2022   Seasonal allergies    Snoring 06/06/2022   Past Surgical History:  Procedure Laterality Date   BREAST LUMPECTOMY Right    MASS EXCISION N/A 05/28/2022   Procedure: EXCISION MASS POSTERIOR NECK;  Surgeon: Violeta Gelinas, MD;  Location: [facility] OR;  Service: General;  Laterality: N/A;   Social History   Tobacco Use   Smoking status: Never   Smokeless tobacco: Never  Substance Use Topics   Alcohol use: Yes    Comment: Occassionally   Drug use: Never   Family History  Problem Relation Age of Onset   Diabetes Mother    Asthma Mother    Hypertension Mother    Early death Father    Asthma Sister    Hypertension Brother    Diabetes Brother    Diabetes Brother    Early death Maternal Grandmother    Allergies  Allergen  Reactions   Actical Shortness Of Breath   Multiple Minerals-Vitamins Shortness Of Breath    Cigarette Smoke   Viteyes Smokers Formula-Lutein [Actical] Shortness Of Breath    Cigarette Smoke    Bee Pollen Itching and Other (See Comments)    Sneezing   Grass Pollen(K-O-R-T-Swt Vern) Itching, Other (See Comments) and Cough    Sneezing   Pollen Extract-Tree Extract [Pollen Extract] Itching, Other (See Comments) and Cough    Sneezing   Cat Hair Extract Itching   Dust Mite Extract Itching   Latex Itching and Swelling      ROS Negative unless stated above    Objective:     BP 120/84 (BP Location: Left Arm, Patient Position: Sitting, Cuff Size: Normal)   Pulse 81   Temp 99 F (37.2 C) (Oral)   Ht 5\' 3"  (1.6 m)   Wt 172 lb 12.8 oz (78.4 kg)   SpO2 95%   BMI 30.61 kg/m  BP Readings from Last 3 Encounters:  10/28/23 120/84  07/15/23 132/80  04/29/23 132/86   Wt Readings from Last 3 Encounters:  10/28/23 172 lb 12.8 oz (78.4 kg)  07/15/23 166 lb (75.3 kg)  04/29/23 170 lb 12.8 oz (77.5 kg)      Physical Exam Constitutional:      Appearance: Normal appearance.  HENT:  Head: Normocephalic and atraumatic.     Nose: Nose normal.     Mouth/Throat:     Mouth: Mucous membranes are moist.  Eyes:     Extraocular Movements: Extraocular movements intact.     Conjunctiva/sclera: Conjunctivae normal.  Cardiovascular:     Rate and Rhythm: Normal rate.  Pulmonary:     Effort: Pulmonary effort is normal.  Musculoskeletal:        General: Normal range of motion.     Right knee: No swelling, deformity or bony tenderness. Normal range of motion. No tenderness. Abnormal patellar mobility.     Left knee: Effusion present. Normal patellar mobility.     Comments: No crepitus of bilateral knees.  Very minimal sensation of grinding in posterior right patella.  Laxity of R patellar tendon.  Left patellar tendon normal.  No TTP of bilateral knee joint lines.  No Baker's cyst noted.   Skin:    General: Skin is warm and dry.          Comments: Right lower abdomen superior to groin with a 1.5 cm slightly raised, dry, stuck on appearing lesion.  No erythema, tenderness, or drainage noted.  Neurological:     Mental Status: She is alert and oriented to person, place, and time.     No results found for any visits on 10/28/23.    Assessment & Plan:  Acute pain of right knee  Tendinopathy of knee  Subluxation of right patella, initial encounter  Seborrheic keratosis  Acute right knee pain likely 2/2 overuse and laxity of patellar tendon.  Discussed exercises to strengthen quadricep muscles.  Given handout of exercises.  Also discussed compression, NSAIDs, wearing supportive shoes, and other supportive care.  For continued or worsening symptoms consider referral to PT.  Area of concern on lower abdomen appears to be a seborrheic keratosis.  Discussed removal options.  Patient wishes to wait at this time.  Return if symptoms worsen or fail to improve.   Deeann Saint, MD

## 2023-11-09 ENCOUNTER — Other Ambulatory Visit: Payer: Self-pay | Admitting: Pulmonary Disease

## 2023-11-09 DIAGNOSIS — J4520 Mild intermittent asthma, uncomplicated: Secondary | ICD-10-CM

## 2023-11-24 ENCOUNTER — Other Ambulatory Visit: Payer: Self-pay | Admitting: Family Medicine

## 2023-11-24 DIAGNOSIS — Z8619 Personal history of other infectious and parasitic diseases: Secondary | ICD-10-CM

## 2023-11-27 ENCOUNTER — Telehealth: Payer: Self-pay | Admitting: Family Medicine

## 2023-11-27 ENCOUNTER — Other Ambulatory Visit: Payer: Self-pay

## 2023-11-27 DIAGNOSIS — M25561 Pain in right knee: Secondary | ICD-10-CM

## 2023-11-27 NOTE — Addendum Note (Signed)
Addended by: Philipp Deputy A on: 11/27/2023 03:16 PM   Modules accepted: Orders

## 2023-11-27 NOTE — Telephone Encounter (Signed)
Pt requesting order for physical therapy, says the condition was discussed at her last appointment. Says it is covered by her insurance at [facility]

## 2023-11-27 NOTE — Addendum Note (Signed)
Addended by: Philipp Deputy A on: 11/27/2023 03:23 PM   Modules accepted: Orders

## 2023-12-22 ENCOUNTER — Other Ambulatory Visit (HOSPITAL_BASED_OUTPATIENT_CLINIC_OR_DEPARTMENT_OTHER): Payer: Self-pay | Admitting: Cardiovascular Disease

## 2023-12-25 ENCOUNTER — Encounter: Payer: Self-pay | Admitting: Family Medicine

## 2023-12-25 ENCOUNTER — Ambulatory Visit (INDEPENDENT_AMBULATORY_CARE_PROVIDER_SITE_OTHER): Payer: 59

## 2023-12-25 ENCOUNTER — Ambulatory Visit: Payer: 59 | Admitting: Family Medicine

## 2023-12-25 VITALS — BP 130/80 | HR 71 | Temp 98.2°F | Ht 63.0 in | Wt 174.4 lb

## 2023-12-25 DIAGNOSIS — Z0000 Encounter for general adult medical examination without abnormal findings: Secondary | ICD-10-CM | POA: Diagnosis not present

## 2023-12-25 DIAGNOSIS — I10 Essential (primary) hypertension: Secondary | ICD-10-CM | POA: Diagnosis not present

## 2023-12-25 DIAGNOSIS — E782 Mixed hyperlipidemia: Secondary | ICD-10-CM | POA: Diagnosis not present

## 2023-12-25 DIAGNOSIS — Z8701 Personal history of pneumonia (recurrent): Secondary | ICD-10-CM | POA: Diagnosis not present

## 2023-12-25 DIAGNOSIS — Z131 Encounter for screening for diabetes mellitus: Secondary | ICD-10-CM

## 2023-12-25 DIAGNOSIS — Z1231 Encounter for screening mammogram for malignant neoplasm of breast: Secondary | ICD-10-CM

## 2023-12-25 LAB — CBC WITH DIFFERENTIAL/PLATELET
Basophils Absolute: 0 10*3/uL (ref 0.0–0.1)
Basophils Relative: 0.8 % (ref 0.0–3.0)
Eosinophils Absolute: 0.1 10*3/uL (ref 0.0–0.7)
Eosinophils Relative: 2 % (ref 0.0–5.0)
HCT: 40.9 % (ref 36.0–46.0)
Hemoglobin: 13.5 g/dL (ref 12.0–15.0)
Lymphocytes Relative: 20.9 % (ref 12.0–46.0)
Lymphs Abs: 1.2 10*3/uL (ref 0.7–4.0)
MCHC: 33.1 g/dL (ref 30.0–36.0)
MCV: 92 fL (ref 78.0–100.0)
Monocytes Absolute: 0.5 10*3/uL (ref 0.1–1.0)
Monocytes Relative: 8 % (ref 3.0–12.0)
Neutro Abs: 3.9 10*3/uL (ref 1.4–7.7)
Neutrophils Relative %: 68.3 % (ref 43.0–77.0)
Platelets: 234 10*3/uL (ref 150.0–400.0)
RBC: 4.44 Mil/uL (ref 3.87–5.11)
RDW: 13.5 % (ref 11.5–15.5)
WBC: 5.7 10*3/uL (ref 4.0–10.5)

## 2023-12-25 LAB — LIPID PANEL
Cholesterol: 157 mg/dL (ref 0–200)
HDL: 55.7 mg/dL (ref >39.00)
LDL Cholesterol: 77 mg/dL (ref 0–99)
NonHDL: 101.22
Total CHOL/HDL Ratio: 3
Triglycerides: 119 mg/dL (ref 0.0–149.0)
VLDL: 23.8 mg/dL (ref 0.0–40.0)

## 2023-12-25 LAB — COMPREHENSIVE METABOLIC PANEL WITH GFR
ALT: 37 U/L — ABNORMAL HIGH (ref 0–35)
AST: 27 U/L (ref 0–37)
Albumin: 4.6 g/dL (ref 3.5–5.2)
Alkaline Phosphatase: 56 U/L (ref 39–117)
BUN: 17 mg/dL (ref 6–23)
CO2: 27 meq/L (ref 19–32)
Calcium: 9.4 mg/dL (ref 8.4–10.5)
Chloride: 103 meq/L (ref 96–112)
Creatinine, Ser: 0.75 mg/dL (ref 0.40–1.20)
GFR: 87.33 mL/min
Glucose, Bld: 95 mg/dL (ref 70–99)
Potassium: 3.4 meq/L — ABNORMAL LOW (ref 3.5–5.1)
Sodium: 139 meq/L (ref 135–145)
Total Bilirubin: 1 mg/dL (ref 0.2–1.2)
Total Protein: 7.5 g/dL (ref 6.0–8.3)

## 2023-12-25 LAB — TSH: TSH: 1.35 u[IU]/mL (ref 0.35–5.50)

## 2023-12-25 LAB — HEMOGLOBIN A1C: Hgb A1c MFr Bld: 6.3 % (ref 4.6–6.5)

## 2023-12-25 NOTE — Progress Notes (Signed)
 Established Patient Office Visit   Subjective  Patient ID: Ebony Arellano, female    DOB: Apr 15, 1966  Age: 58 y.o. MRN: [id number]  Chief Complaint  Patient presents with   Annual Exam    Pt is a 58 yo female seen for CPE.  Pt states she is adjusting after the recent death of her mother.  Pt was also seen at [facility] for pna 10/2023.  Completed course of azithromycin and amoxicillin .  Given inhaler, Wixela made symptoms worse.  Had questions about xray results.  Per chart review CXR with scarring versus atelectasis of the lingula.  Pt denies SOB, CP, increased fatigue, continued cough.  Pt would like to set up PT for R knee.  States wearing knee brace helps, but sometimes her knee cap goes one way and her knee goes the other.  May need referral reopened.  Pt had difficulty setting up mammogram last yr.  [facility]    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   Snoring 06/06/2022   Essential hypertension 10/27/1992   Past Medical History:  Diagnosis Date   Essential hypertension 10/27/1992   Pure hypercholesterolemia 11/05/2022   Seasonal allergies    Snoring 06/06/2022   Past Surgical History:  Procedure Laterality Date   BREAST LUMPECTOMY Right    MASS EXCISION N/A 05/28/2022   Procedure: EXCISION MASS POSTERIOR NECK;  Surgeon: Sebastian Moles, MD;  Location: [facility] OR;  Service: General;  Laterality: N/A;   Social History   Tobacco Use   Smoking status: Never   Smokeless tobacco: Never  Substance Use Topics   Alcohol use: Yes    Comment: Occassionally   Drug use: Never   Family History  Problem Relation Age of Onset   Diabetes Mother    Asthma Mother    Hypertension Mother    Early death Father    Asthma Sister    Hypertension Brother    Diabetes Brother    Diabetes Brother    Early death Maternal Grandmother    Allergies  Allergen Reactions    Actical Shortness Of Breath   Multiple Minerals-Vitamins Shortness Of Breath    Cigarette Smoke   Viteyes Smokers Formula-Lutein [Actical] Shortness Of Breath    Cigarette Smoke    Bee Pollen Itching and Other (See Comments)    Sneezing   Grass Pollen(K-O-R-T-Swt Vern) Itching, Other (See Comments) and Cough    Sneezing   Pollen Extract-Tree Extract [Pollen Extract] Itching, Other (See Comments) and Cough    Sneezing   Cat Hair Extract Itching   Dust Mite Extract Itching   Latex Itching and Swelling      ROS Negative unless stated above    Objective:     BP 130/80 (BP Location: Left Arm, Patient Position: Sitting, Cuff Size: Normal)   Pulse 71   Temp 98.2 F (36.8 C) (Oral)   Ht 5' 3 (1.6 m)   Wt 174 lb 6.4 oz (79.1 kg)   SpO2 96%   BMI 30.89 kg/m  BP Readings from Last 3 Encounters:  12/25/23 130/80  10/28/23 120/84  07/15/23 132/80   Wt Readings from Last 3 Encounters:  12/25/23 174 lb 6.4 oz (79.1 kg)  10/28/23 172 lb 12.8 oz (78.4 kg)  07/15/23 166 lb (75.3 kg)      Physical Exam Constitutional:      Appearance: Normal appearance.  HENT:  Head: Normocephalic and atraumatic.     Right Ear: Tympanic membrane, ear canal and external ear normal.     Left Ear: Tympanic membrane, ear canal and external ear normal.     Nose: Nose normal.     Mouth/Throat:     Mouth: Mucous membranes are moist.     Pharynx: No oropharyngeal exudate or posterior oropharyngeal erythema.  Eyes:     General: No scleral icterus.    Extraocular Movements: Extraocular movements intact.     Conjunctiva/sclera: Conjunctivae normal.     Pupils: Pupils are equal, round, and reactive to light.  Neck:     Thyroid: No thyromegaly.  Cardiovascular:     Rate and Rhythm: Normal rate and regular rhythm.     Pulses: Normal pulses.     Heart sounds: Normal heart sounds. No murmur heard.    No friction rub.  Pulmonary:     Effort: Pulmonary effort is normal.     Breath sounds: Normal  breath sounds. No wheezing, rhonchi or rales.  Abdominal:     General: Bowel sounds are normal.     Palpations: Abdomen is soft.     Tenderness: There is no abdominal tenderness.  Musculoskeletal:        General: No deformity. Normal range of motion.  Lymphadenopathy:     Cervical: No cervical adenopathy.  Skin:    General: Skin is warm and dry.     Findings: No lesion.  Neurological:     General: No focal deficit present.     Mental Status: She is alert and oriented to person, place, and time.  Psychiatric:        Mood and Affect: Mood normal.        Thought Content: Thought content normal.      No results found for any visits on 12/25/23.    Assessment & Plan:  Well adult exam -Age-appropriate health screenings discussed -Obtain labs -Immunizations reviewed.  Patient declines at this time. -Colonoscopy done 04/30/2022 -Mammogram last done 04/24/2022.  Referral placed given issues scheduling last year. -Pap done 03/15/2022 -Next CPE in 1 year -     Hemoglobin A1c; Future  Mixed hyperlipidemia -Continue Crestor  20 mg daily -Lifestyle modifications encouraged -     Comprehensive metabolic panel; Future -     Lipid panel; Future  Essential hypertension -Controlled -Continue lifestyle modifications -Continue chlorthalidone  25 mg daily -     CBC with Differential/Platelet; Future -     Comprehensive metabolic panel; Future -     TSH; Future  History of pneumonia -dx'd at [facility] on 11/05/2023.  Completed course of amoxicillin  and azithromycin. -DG chest 11/05/2023 with lingular subsegmental atelectasis or scarring.  No infiltrate or consolidation noted. -Will obtain follow-up x-ray to ensure resolution -     DG Chest 2 View  Encounter for screening mammogram for malignant neoplasm of breast -     3D Screening Mammogram, Left and Right; Future   Return if symptoms worsen or fail to improve.   Clotilda JONELLE Single, MD

## 2023-12-26 ENCOUNTER — Other Ambulatory Visit: Payer: Self-pay | Admitting: Family Medicine

## 2023-12-26 DIAGNOSIS — E876 Hypokalemia: Secondary | ICD-10-CM

## 2023-12-26 MED ORDER — POTASSIUM CHLORIDE CRYS ER 20 MEQ PO TBCR: 40.0000 meq | EXTENDED_RELEASE_TABLET | Freq: Every day | ORAL | 0 refills | Status: DC

## 2024-01-01 ENCOUNTER — Other Ambulatory Visit (HOSPITAL_BASED_OUTPATIENT_CLINIC_OR_DEPARTMENT_OTHER): Payer: Self-pay | Admitting: Family Medicine

## 2024-01-17 ENCOUNTER — Ambulatory Visit
Admission: RE | Admit: 2024-01-17 | Discharge: 2024-01-17 | Disposition: A | Discharge status: Home / Self Care | Payer: 59 | Source: Ambulatory Visit | Attending: Family Medicine

## 2024-01-17 DIAGNOSIS — Z1231 Encounter for screening mammogram for malignant neoplasm of breast: Secondary | ICD-10-CM

## 2024-04-01 ENCOUNTER — Other Ambulatory Visit (HOSPITAL_BASED_OUTPATIENT_CLINIC_OR_DEPARTMENT_OTHER): Payer: Self-pay | Admitting: Family Medicine

## 2024-06-03 ENCOUNTER — Other Ambulatory Visit: Payer: Self-pay | Admitting: Family Medicine

## 2024-06-03 DIAGNOSIS — Z8619 Personal history of other infectious and parasitic diseases: Secondary | ICD-10-CM

## 2024-07-08 ENCOUNTER — Telehealth: Payer: Self-pay

## 2024-07-08 DIAGNOSIS — J4520 Mild intermittent asthma, uncomplicated: Secondary | ICD-10-CM

## 2024-07-08 NOTE — Telephone Encounter (Signed)
 Copied from CRM [id number]. Topic: Appointments - Appointment Scheduling >> Jul 08, 2024  2:42 PM Rilla B wrote: Patient received a notice that she needed to schedule PFT by 07/14/2024. Attempted, unable to schedule PFT as no slots available. When selecting after 07/15/2024 system states order expires... Patient would like to have PFT/visit after 7/29 if possible. Please call patient to schedule.  Please schedule pt appt and PFT. Thank you

## 2024-07-09 NOTE — Telephone Encounter (Signed)
 LM and sent letter. Marcus, please put in a new PFT order for after 7/30. Thanks.

## 2024-07-09 NOTE — Telephone Encounter (Signed)
 New PFT order has been placed. Please schedule.

## 2024-09-06 ENCOUNTER — Other Ambulatory Visit: Payer: Self-pay | Admitting: Pulmonary Disease

## 2024-09-06 DIAGNOSIS — J310 Chronic rhinitis: Secondary | ICD-10-CM

## 2024-09-15 ENCOUNTER — Encounter: Payer: Self-pay | Admitting: Pulmonary Disease

## 2024-09-15 ENCOUNTER — Ambulatory Visit: Admitting: Pulmonary Disease

## 2024-09-15 ENCOUNTER — Ambulatory Visit: Admitting: *Deleted

## 2024-09-15 VITALS — BP 129/82 | HR 86 | Ht 65.0 in | Wt 181.0 lb

## 2024-09-15 DIAGNOSIS — J984 Other disorders of lung: Secondary | ICD-10-CM

## 2024-09-15 DIAGNOSIS — G4733 Obstructive sleep apnea (adult) (pediatric): Secondary | ICD-10-CM

## 2024-09-15 DIAGNOSIS — J4520 Mild intermittent asthma, uncomplicated: Secondary | ICD-10-CM

## 2024-09-15 DIAGNOSIS — R0981 Nasal congestion: Secondary | ICD-10-CM

## 2024-09-15 LAB — PULMONARY FUNCTION TEST
DL/VA % pred: 103 %
DL/VA: 4.34 ml/min/mmHg/L
DLCO cor % pred: 75 %
DLCO cor: 15.88 ml/min/mmHg
DLCO unc % pred: 75 %
DLCO unc: 15.88 ml/min/mmHg
FEF 25-75 Post: 2.15 L/s
FEF 25-75 Pre: 1.72 L/s
FEF2575-%Change-Post: 24 %
FEF2575-%Pred-Post: 87 %
FEF2575-%Pred-Pre: 70 %
FEV1-%Change-Post: 2 %
FEV1-%Pred-Post: 73 %
FEV1-%Pred-Pre: 71 %
FEV1-Post: 1.96 L
FEV1-Pre: 1.92 L
FEV1FVC-%Change-Post: 5 %
FEV1FVC-%Pred-Pre: 100 %
FEV6-%Change-Post: -2 %
FEV6-%Pred-Post: 70 %
FEV6-%Pred-Pre: 73 %
FEV6-Post: 2.37 L
FEV6-Pre: 2.44 L
FEV6FVC-%Change-Post: 0 %
FEV6FVC-%Pred-Post: 103 %
FEV6FVC-%Pred-Pre: 103 %
FVC-%Change-Post: -2 %
FVC-%Pred-Post: 68 %
FVC-%Pred-Pre: 70 %
FVC-Post: 2.37 L
FVC-Pre: 2.44 L
Post FEV1/FVC ratio: 83 %
Post FEV6/FVC ratio: 100 %
Pre FEV1/FVC ratio: 79 %
Pre FEV6/FVC Ratio: 100 %
RV % pred: 67 %
RV: 1.37 L
TLC % pred: 73 %
TLC: 3.82 L

## 2024-09-15 MED ORDER — ALBUTEROL SULFATE HFA 108 (90 BASE) MCG/ACT IN AERS: 2.0000 | INHALATION_SPRAY | Freq: Four times a day (QID) | RESPIRATORY_TRACT | 6 refills | Status: AC | PRN

## 2024-09-15 NOTE — Patient Instructions (Addendum)
 Your breathing tests show a mild restriction and diffusion defect as discussed  We will check a CT Chest scan to look at the lung tissue for possible inflammation  Use albuterol inhaler 1-2 puffs every 4-6 hours as needed  Continue flonase   We will schedule you for home sleep study  Follow up in 1 month to review CT Chest scan results

## 2024-09-15 NOTE — Progress Notes (Unsigned)
 Synopsis: Referred in July 2024 for shortness of breath  Subjective:   PATIENT ID: Ebony Arellano GENDER: female DOB: [birth year], MRN: [id number]  HPI  No chief complaint on file.  Ebony Arellano is a 58 year old woman, never smoker with history of hypertension and seasonal allergies who returns to pulmonary clinic for shortness of breath.  Initial OV 07/15/23 She reports having shortness of breath since child hood. No dx of childhood asthma. She reports more frequent episodes over recent weeks. She is having episodes 2-3 times every other week depending on her exposures at work.  She reports increased symptoms when exposed to strong perfumes or strong scents from hairsprays, cat dander and seasonal allergies in the fall and spring.  She denies any issues with cold air.  She denies any nighttime awakenings.  She has not not used an inhaler in the past.  She has sinus congestion with postnasal drainage along with intermittent sinus pressure.  She is a never smoker she does report secondhand smoke in childhood.  She grew up in [place] and worked/lived in [place] until she retired and moved to [place]  recently.  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  OV 09/15/24   Past Medical History:  Diagnosis Date   Essential hypertension 10/27/1992   Pure hypercholesterolemia 11/05/2022   Seasonal allergies    Snoring 06/06/2022     Family History  Problem Relation Age of Onset   Diabetes Mother    Asthma Mother    Hypertension Mother    Early death Father    Asthma Sister    Hypertension Brother    Diabetes Brother    Diabetes Brother    Early death Maternal Grandmother      Social History   Socioeconomic History   Marital status: Single    Spouse name: Not on file   Number of children: Not on file   Years of education: Not on file   Highest education level: Professional school degree (e.g., MD, DDS, DVM, JD)  Occupational  History   Not on file  Tobacco Use   Smoking status: Never   Smokeless tobacco: Never  Substance and Sexual Activity   Alcohol use: Yes    Comment: Occassionally   Drug use: Never   Sexual activity: Not on file  Other Topics Concern   Not on file  Social History Narrative   Not on file   Social Drivers of Health   Financial Resource Strain: Low Risk  (10/28/2023)   Overall Financial Resource Strain (CARDIA)    Difficulty of Paying Living Expenses: Not hard at all  Food Insecurity: No Food Insecurity (10/28/2023)   Hunger Vital Sign    Worried About Running Out of Food in the Last Year: Never true    Ran Out of Food in the Last Year: Never true  Transportation Needs: No Transportation Needs (10/28/2023)   PRAPARE - Administrator, Civil Service (Medical): No    Lack of Transportation (Non-Medical): No  Physical Activity: Unknown (10/28/2023)   Exercise Vital Sign    Days of Exercise per Week: 0 days    Minutes of Exercise per Session: Not on file  Recent Concern: Physical Activity - Inactive (10/28/2023)   Exercise Vital Sign    Days of Exercise per Week: 0 days    Minutes of Exercise per Session: 10 min  Stress: Stress Concern Present (10/28/2023)   [employer] of Occupational Health - Occupational Stress  Questionnaire    Feeling of Stress : Very much  Social Connections: Moderately Integrated (10/28/2023)   Social Connection and Isolation Panel    Frequency of Communication with Friends and Family: More than three times a week    Frequency of Social Gatherings with Friends and Family: Patient declined    Attends Religious Services: More than 4 times per year    Active Member of [employer] or Organizations: Yes    Attends Banker Meetings: 1 to 4 times per year    Marital Status: Never married  Catering manager Violence: Not on file     Allergies  Allergen Reactions   Actical Shortness Of Breath   Multiple Minerals-Vitamins Shortness Of  Breath    Cigarette Smoke   Viteyes Smokers Formula-Lutein [Actical] Shortness Of Breath    Cigarette Smoke    Bee Pollen Itching and Other (See Comments)    Sneezing   Grass Pollen(K-O-R-T-Swt Vern) Itching, Other (See Comments) and Cough    Sneezing   Pollen Extract-Tree Extract [Pollen Extract] Itching, Other (See Comments) and Cough    Sneezing   Cat Dander Itching   Dust Mite Extract Itching   Latex Itching and Swelling     Outpatient Medications Prior to Visit  Medication Sig Dispense Refill   potassium chloride  SA (KLOR-CON  M) 20 MEQ tablet Take 2 tablets (40 mEq total) by mouth daily for 3 days. Tabs can be broken or dissolved in small amount of water. 6 tablet 0   chlorthalidone  (HYGROTON ) 25 MG tablet TAKE 1/2 TABLET BY MOUTH DAILY 45 tablet 3   fexofenadine (ALLEGRA) 180 MG tablet Take 180 mg by mouth daily.     fluticasone  (FLONASE ) 50 MCG/ACT nasal spray SPRAY 1 SPRAY INTO BOTH NOSTRILS DAILY. 16 mL 3   ketotifen ([employer] ALLERGY EYE DROPS) 0.025 % ophthalmic solution Place 1 drop into both eyes 2 (two) times daily as needed (allergies).     METAMUCIL FIBER PO Take 3 capsules by mouth in the morning, at noon, and at bedtime.     Omega-3 Fatty Acids (FISH OIL) 1000 MG CAPS Take 1,000 mg by mouth daily.     rosuvastatin  (CRESTOR ) 20 MG tablet TAKE 1 TABLET BY MOUTH EVERY DAY 90 tablet 3   valACYclovir  (VALTREX ) 500 MG tablet TAKE 1 TABLET (500 MG TOTAL) BY MOUTH AS NEEDED. TAKE BY MOUTH AS NEEDED. 90 tablet 1   No facility-administered medications prior to visit.    Review of Systems  Constitutional:  Negative for chills, fever, malaise/fatigue and weight loss.  HENT:  Positive for congestion. Negative for sinus pain and sore throat.   Eyes: Negative.   Respiratory:  Positive for cough, shortness of breath and wheezing. Negative for hemoptysis and sputum production.   Cardiovascular:  Negative for chest pain, palpitations, orthopnea, claudication and leg swelling.   Gastrointestinal:  Negative for abdominal pain, heartburn, nausea and vomiting.  Genitourinary: Negative.   Musculoskeletal:  Negative for joint pain and myalgias.  Skin:  Negative for rash.  Neurological:  Negative for weakness.  Endo/Heme/Allergies:  Positive for environmental allergies.  Psychiatric/Behavioral: Negative.     Objective:   There were no vitals filed for this visit.  Physical Exam Constitutional:      General: She is not in acute distress.    Appearance: Normal appearance.  Eyes:     General: No scleral icterus.    Conjunctiva/sclera: Conjunctivae normal.  Cardiovascular:     Rate and Rhythm: Normal rate and regular rhythm.  Pulmonary:     Breath sounds: No wheezing, rhonchi or rales.  Musculoskeletal:     Right lower leg: No edema.     Left lower leg: No edema.  Skin:    General: Skin is warm and dry.  Neurological:     General: No focal deficit present.     CBC    Component Value Date/Time   WBC 5.7 12/25/2023 1213   RBC 4.44 12/25/2023 1213   HGB 13.5 12/25/2023 1213   HCT 40.9 12/25/2023 1213   PLT 234.0 12/25/2023 1213   MCV 92.0 12/25/2023 1213   MCHC 33.1 12/25/2023 1213   RDW 13.5 12/25/2023 1213   LYMPHSABS 1.2 12/25/2023 1213   MONOABS 0.5 12/25/2023 1213   EOSABS 0.1 12/25/2023 1213   BASOSABS 0.0 12/25/2023 1213     Chest imaging:  PFT:     No data to display          Labs:  Path:  Echo:  Heart Catheterization:    Assessment & Plan:   Mild intermittent reactive airway disease without complication  Moderate obstructive sleep apnea  Discussion: Ebony Arellano is a 58 year old woman, never smoker with history of hypertension and seasonal allergies who was referred to pulmonary clinic for shortness of breath.  She appears to have mild intermittent reactive airways disease, allergic rhinitis with postnasal drainage.  She is to start Breo Ellipta  1 puff daily and monitor for improvement in her shortness of  breath.  She is to use fluticasone  nasal spray 1 spray per nostril daily for nasal congestion and drainage.  She has moderate obstructive sleep apnea based on sleep study from last year.  She was not started on CPAP after the sleep study as she was told her insurance did not cover CPAP therapy at the time. We will order her CPAP machine with humidification along with supplies and a f mask fitting session for nasal pillow mask.  She is to call us  and schedule a 1 month follow-up for CPAP compliance review after she starts her CPAP treatment.  Follow-up in 4 months with pulmonary function test.  Dorn Chill, MD [facility] Office: [phone]   Current Outpatient Medications:    potassium chloride  SA (KLOR-CON  M) 20 MEQ tablet, Take 2 tablets (40 mEq total) by mouth daily for 3 days. Tabs can be broken or dissolved in small amount of water., Disp: 6 tablet, Rfl: 0   chlorthalidone  (HYGROTON ) 25 MG tablet, TAKE 1/2 TABLET BY MOUTH DAILY, Disp: 45 tablet, Rfl: 3   fexofenadine (ALLEGRA) 180 MG tablet, Take 180 mg by mouth daily., Disp: , Rfl:    fluticasone  (FLONASE ) 50 MCG/ACT nasal spray, SPRAY 1 SPRAY INTO BOTH NOSTRILS DAILY., Disp: 16 mL, Rfl: 3   ketotifen ([employer]S ALLERGY EYE DROPS) 0.025 % ophthalmic solution, Place 1 drop into both eyes 2 (two) times daily as needed (allergies)., Disp: , Rfl:    METAMUCIL FIBER PO, Take 3 capsules by mouth in the morning, at noon, and at bedtime., Disp: , Rfl:    Omega-3 Fatty Acids (FISH OIL) 1000 MG CAPS, Take 1,000 mg by mouth daily., Disp: , Rfl:    rosuvastatin  (CRESTOR ) 20 MG tablet, TAKE 1 TABLET BY MOUTH EVERY DAY, Disp: 90 tablet, Rfl: 3   valACYclovir  (VALTREX ) 500 MG tablet, TAKE 1 TABLET (500 MG TOTAL) BY MOUTH AS NEEDED. TAKE BY MOUTH AS NEEDED., Disp: 90 tablet, Rfl: 1

## 2024-09-15 NOTE — Progress Notes (Signed)
 Full PFT performed today.

## 2024-09-15 NOTE — Patient Instructions (Signed)
 Full PFT performed today.

## 2024-09-18 ENCOUNTER — Encounter: Payer: Self-pay | Admitting: Pulmonary Disease

## 2024-09-25 ENCOUNTER — Ambulatory Visit (HOSPITAL_BASED_OUTPATIENT_CLINIC_OR_DEPARTMENT_OTHER)
Admission: RE | Admit: 2024-09-25 | Discharge: 2024-09-25 | Disposition: A | Discharge status: Home / Self Care | Source: Ambulatory Visit | Attending: Pulmonary Disease | Admitting: Pulmonary Disease

## 2024-09-25 DIAGNOSIS — J984 Other disorders of lung: Secondary | ICD-10-CM | POA: Insufficient documentation

## 2024-10-04 ENCOUNTER — Ambulatory Visit: Payer: Self-pay | Admitting: Pulmonary Disease

## 2024-10-08 ENCOUNTER — Encounter: Payer: Self-pay | Admitting: Pulmonary Disease

## 2024-10-08 ENCOUNTER — Ambulatory Visit: Admitting: Pulmonary Disease

## 2024-10-08 VITALS — BP 131/84 | HR 77 | Ht 65.0 in | Wt 179.0 lb

## 2024-10-08 DIAGNOSIS — J4520 Mild intermittent asthma, uncomplicated: Secondary | ICD-10-CM | POA: Diagnosis not present

## 2024-10-08 DIAGNOSIS — G4733 Obstructive sleep apnea (adult) (pediatric): Secondary | ICD-10-CM | POA: Diagnosis not present

## 2024-10-08 NOTE — Progress Notes (Addendum)
 Synopsis: Referred in July 2024 for shortness of breath  Subjective:   PATIENT ID: Ebony Arellano GENDER: female DOB: 1966-10-13, MRN: [id number]  HPI  Chief Complaint  Patient presents with   Medical Management of Chronic Issues    Pt states HST not coved by ins would like to know if another way would be cover to do the sleep study    Ebony Arellano is a 58 year old woman, never smoker with history of hypertension and seasonal allergies who returns to pulmonary clinic for shortness of breath.  Initial OV 07/15/23 She reports having shortness of breath since child hood. No dx of childhood asthma. She reports more frequent episodes over recent weeks. She is having episodes 2-3 times every other week depending on her exposures at work.  She reports increased symptoms when exposed to strong perfumes or strong scents from hairsprays, cat dander and seasonal allergies in the fall and spring.  She denies any issues with cold air.  She denies any nighttime awakenings.  She has not not used an inhaler in the past.  She has sinus congestion with postnasal drainage along with intermittent sinus pressure.  She is a never smoker she does report secondhand smoke in childhood.  She grew up in [place] and worked/lived in [place] until she retired and moved to [place]  recently.  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  OV 09/15/24 She experiences increased difficulty in breathing after using Wixela, which exacerbated her symptoms and altered her voice. She discontinued its use after the first inhaler ran out. Currently, she uses Fluticasone  for nasal congestion, which has been helpful.  She has not been approved for a CPAP machine and has not used one in the past. She does not wake up feeling rested and refreshed. There are no headaches, waking up from snoring or gasping for air, and she does not have an albuterol inhaler at home.  Breathing tests show  mild restriction and diffusion defect.  She had secondhand smoke exposure during childhood. 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  OV 10/08/24 She is facing challenges with sleep apnea management due to insurance coverage issues with a previous home sleep study, which cost $275 and was later reduced to $255. She is hesitant to undergo another study without assurance of receiving a CPAP machine and is considering an in-lab sleep study to potentially reduce costs.  She experiences shortness of breath, especially when walking uphill outdoors, which is more difficult than climbing stairs indoors. She is attempting to increase her physical activity by climbing stairs more frequently.   HRCT Chest did not indicate ILD but did note lobular air trapping.   She does not participate in formal exercise programs but previously practiced Pilates, which was manageable. She is considering increasing her physical activity to address exertional shortness of breath.  Past Medical History:  Diagnosis Date   Essential hypertension 10/27/1992   Pure hypercholesterolemia 11/05/2022   Seasonal allergies    Snoring 06/06/2022     Family History  Problem Relation Age of Onset   Diabetes Mother    Asthma Mother    Hypertension Mother    Early death Father    Asthma Sister    Hypertension Brother    Diabetes Brother    Diabetes Brother    Early death Maternal Grandmother      Social History   Socioeconomic History   Marital  status: Single    Spouse name: Not on file   Number of children: Not on file   Years of education: Not on file   Highest education level: Professional school degree (e.g., MD, DDS, DVM, JD)  Occupational History   Not on file  Tobacco Use   Smoking status: Never   Smokeless tobacco: Never  Substance and Sexual Activity   Alcohol use: Yes    Comment: Occassionally   Drug use: Never   Sexual activity: Not on file   Other Topics Concern   Not on file  Social History Narrative   Not on file   Social Drivers of Health   Financial Resource Strain: Low Risk  (10/28/2023)   Overall Financial Resource Strain (CARDIA)    Difficulty of Paying Living Expenses: Not hard at all  Food Insecurity: No Food Insecurity (10/28/2023)   Hunger Vital Sign    Worried About Running Out of Food in the Last Year: Never true    Ran Out of Food in the Last Year: Never true  Transportation Needs: No Transportation Needs (10/28/2023)   PRAPARE - Administrator, Civil Service (Medical): No    Lack of Transportation (Non-Medical): No  Physical Activity: Unknown (10/28/2023)   Exercise Vital Sign    Days of Exercise per Week: 0 days    Minutes of Exercise per Session: Not on file  Recent Concern: Physical Activity - Inactive (10/28/2023)   Exercise Vital Sign    Days of Exercise per Week: 0 days    Minutes of Exercise per Session: 10 min  Stress: Stress Concern Present (10/28/2023)   [employer] of Occupational Health - Occupational Stress Questionnaire    Feeling of Stress : Very much  Social Connections: Moderately Integrated (10/28/2023)   Social Connection and Isolation Panel    Frequency of Communication with Friends and Family: More than three times a week    Frequency of Social Gatherings with Friends and Family: Patient declined    Attends Religious Services: More than 4 times per year    Active Member of [employer] or Organizations: Yes    Attends Banker Meetings: 1 to 4 times per year    Marital Status: Never married  Catering Manager Violence: Not on file     Allergies  Allergen Reactions   Actical Shortness Of Breath   Multiple Minerals-Vitamins Shortness Of Breath    Cigarette Smoke   Viteyes Smokers Formula-Lutein [Actical] Shortness Of Breath    Cigarette Smoke    Bee Pollen Itching and Other (See Comments)    Sneezing   Grass Pollen(K-O-R-T-Swt Vern) Itching, Other  (See Comments) and Cough    Sneezing   Pollen Extract-Tree Extract [Pollen Extract] Itching, Other (See Comments) and Cough    Sneezing   Cat Dander Itching   Dust Mite Extract Itching   Latex Itching and Swelling     Outpatient Medications Prior to Visit  Medication Sig Dispense Refill   albuterol (VENTOLIN HFA) 108 (90 Base) MCG/ACT inhaler Inhale 2 puffs into the lungs every 6 (six) hours as needed for wheezing or shortness of breath. 8 g 6   chlorthalidone  (HYGROTON ) 25 MG tablet TAKE 1/2 TABLET BY MOUTH DAILY 45 tablet 3   fexofenadine (ALLEGRA) 180 MG tablet Take 180 mg by mouth daily.     fluticasone  (FLONASE ) 50 MCG/ACT nasal spray SPRAY 1 SPRAY INTO BOTH NOSTRILS DAILY. 16 mL 3   ketotifen ([employer]S ALLERGY EYE DROPS) 0.025 % ophthalmic solution Place  1 drop into both eyes 2 (two) times daily as needed (allergies).     METAMUCIL FIBER PO Take 3 capsules by mouth in the morning, at noon, and at bedtime.     Omega-3 Fatty Acids (FISH OIL) 1000 MG CAPS Take 1,000 mg by mouth daily.     potassium chloride  SA (KLOR-CON  M) 20 MEQ tablet Take 2 tablets (40 mEq total) by mouth daily for 3 days. Tabs can be broken or dissolved in small amount of water. 6 tablet 0   rosuvastatin  (CRESTOR ) 20 MG tablet TAKE 1 TABLET BY MOUTH EVERY DAY 90 tablet 3   valACYclovir  (VALTREX ) 500 MG tablet TAKE 1 TABLET (500 MG TOTAL) BY MOUTH AS NEEDED. TAKE BY MOUTH AS NEEDED. 90 tablet 1   No facility-administered medications prior to visit.    Review of Systems  Constitutional:  Negative for chills, fever, malaise/fatigue and weight loss.  HENT:  Negative for congestion, sinus pain and sore throat.   Eyes: Negative.   Respiratory:  Positive for shortness of breath. Negative for cough, hemoptysis, sputum production and wheezing.   Cardiovascular:  Negative for chest pain, palpitations, orthopnea, claudication and leg swelling.  Gastrointestinal:  Negative for abdominal pain, heartburn, nausea and vomiting.   Genitourinary: Negative.   Musculoskeletal:  Negative for joint pain and myalgias.  Skin:  Negative for rash.  Neurological:  Negative for weakness.  Psychiatric/Behavioral: Negative.     Objective:   Vitals:   10/08/24 1455  BP: 131/84  Pulse: 77  SpO2: 97%  Weight: 179 lb (81.2 kg)  Height: 5' 5 (1.651 m)    Physical Exam Constitutional:      General: She is not in acute distress.    Appearance: Normal appearance.  Eyes:     General: No scleral icterus.    Conjunctiva/sclera: Conjunctivae normal.  Cardiovascular:     Rate and Rhythm: Normal rate and regular rhythm.  Pulmonary:     Breath sounds: No wheezing, rhonchi or rales.  Musculoskeletal:     Right lower leg: No edema.     Left lower leg: No edema.  Skin:    General: Skin is warm and dry.  Neurological:     General: No focal deficit present.     CBC    Component Value Date/Time   WBC 5.7 12/25/2023 1213   RBC 4.44 12/25/2023 1213   HGB 13.5 12/25/2023 1213   HCT 40.9 12/25/2023 1213   PLT 234.0 12/25/2023 1213   MCV 92.0 12/25/2023 1213   MCHC 33.1 12/25/2023 1213   RDW 13.5 12/25/2023 1213   LYMPHSABS 1.2 12/25/2023 1213   MONOABS 0.5 12/25/2023 1213   EOSABS 0.1 12/25/2023 1213   BASOSABS 0.0 12/25/2023 1213      Latest Ref Rng & Units 12/25/2023   12:13 PM 11/05/2022   10:04 AM 05/15/2022    1:30 PM  BMP  Glucose 70 - 99 mg/dL 95  898  92   BUN 6 - 23 mg/dL 17  15  10    Creatinine 0.40 - 1.20 mg/dL 9.24  9.13  9.08   BUN/Creat Ratio 9 - 23  17  11    Sodium 135 - 145 mEq/L 139  143  139   Potassium 3.5 - 5.1 mEq/L 3.4  3.6  3.7   Chloride 96 - 112 mEq/L 103  103  103   CO2 19 - 32 mEq/L 27  23  21    Calcium  8.4 - 10.5 mg/dL 9.4  9.5  9.2  Chest imaging: HRCT Chest 09/25/24 1. Minimal, bland appearing scarring of the left lung base. No evidence of fibrotic interstitial lung disease. 2. Mild, lobular air trapping on expiratory phase imaging, consistent with small airways  disease.  PFT:    Latest Ref Rng & Units 09/15/2024    9:58 AM  PFT Results  FVC-Pre L 2.44   FVC-Predicted Pre % 70   FVC-Post L 2.37   FVC-Predicted Post % 68   Pre FEV1/FVC % % 79   Post FEV1/FCV % % 83   FEV1-Pre L 1.92   FEV1-Predicted Pre % 71   FEV1-Post L 1.96   DLCO uncorrected ml/min/mmHg 15.88   DLCO UNC% % 75   DLCO corrected ml/min/mmHg 15.88   DLCO COR %Predicted % 75   DLVA Predicted % 103   TLC L 3.82   TLC % Predicted % 73   RV % Predicted % 67     Labs:  Path:  Echo:  Heart Catheterization:    Assessment & Plan:   No diagnosis found.  Discussion: Ebony Arellano is a 58 year old woman, never smoker with history of hypertension and seasonal allergies who was referred to pulmonary clinic for shortness of breath.  Obstructive sleep apnea She has history of moderate obstructive sleep apnea on previous test in 2023. She is overnight. She does not have restful sleep. She does have daytime sleepiness. - Order home sleep study  Small airways disease with exertional dyspnea CT scan showed lobular air trapping, suggesting small airways disease. 24% improvement in FEF 25-75 with albuterol indicates responsiveness. Discussed potential need for different maintenance inhaler. - Recommend using albuterol as needed for exertional dyspnea. - Encourage regular exercise: 20-30 minutes, 4-5 days a week. - Monitor albuterol use and consider alternative maintenance inhaler if needed.  Minimal linear pulmonary scarring CT scan revealed minimal linear scarring, not expected to cause changes in breathing test. Etiology unclear. Considered possibility of inaccurate breathing test results. - Monitor for any changes in symptoms or breathing test results.  Follow up in 6 months  Dorn Chill, MD [facility] Office: [phone]   Current Outpatient Medications:    albuterol (VENTOLIN HFA) 108 (90 Base) MCG/ACT inhaler, Inhale 2 puffs into the  lungs every 6 (six) hours as needed for wheezing or shortness of breath., Disp: 8 g, Rfl: 6   chlorthalidone  (HYGROTON ) 25 MG tablet, TAKE 1/2 TABLET BY MOUTH DAILY, Disp: 45 tablet, Rfl: 3   fexofenadine (ALLEGRA) 180 MG tablet, Take 180 mg by mouth daily., Disp: , Rfl:    fluticasone  (FLONASE ) 50 MCG/ACT nasal spray, SPRAY 1 SPRAY INTO BOTH NOSTRILS DAILY., Disp: 16 mL, Rfl: 3   ketotifen ([employer]S ALLERGY EYE DROPS) 0.025 % ophthalmic solution, Place 1 drop into both eyes 2 (two) times daily as needed (allergies)., Disp: , Rfl:    METAMUCIL FIBER PO, Take 3 capsules by mouth in the morning, at noon, and at bedtime., Disp: , Rfl:    Omega-3 Fatty Acids (FISH OIL) 1000 MG CAPS, Take 1,000 mg by mouth daily., Disp: , Rfl:    potassium chloride  SA (KLOR-CON  M) 20 MEQ tablet, Take 2 tablets (40 mEq total) by mouth daily for 3 days. Tabs can be broken or dissolved in small amount of water., Disp: 6 tablet, Rfl: 0   rosuvastatin  (CRESTOR ) 20 MG tablet, TAKE 1 TABLET BY MOUTH EVERY DAY, Disp: 90 tablet, Rfl: 3   valACYclovir  (VALTREX ) 500 MG tablet, TAKE 1 TABLET (500 MG TOTAL) BY MOUTH AS  NEEDED. TAKE BY MOUTH AS NEEDED., Disp: 90 tablet, Rfl: 1

## 2024-10-08 NOTE — Patient Instructions (Signed)
 Use albuterol inhaler 1-2 puffs every 4-6 hours as needed for shortness of breath  We will schedule you for the in-lab sleep study at [facility]  Follow up in 6 months

## 2024-10-09 ENCOUNTER — Encounter: Payer: Self-pay | Admitting: Pulmonary Disease

## 2024-10-26 ENCOUNTER — Other Ambulatory Visit: Payer: Self-pay

## 2024-10-26 DIAGNOSIS — G4733 Obstructive sleep apnea (adult) (pediatric): Secondary | ICD-10-CM

## 2024-11-02 ENCOUNTER — Telehealth: Payer: Self-pay | Admitting: Pulmonary Disease

## 2024-11-02 NOTE — Telephone Encounter (Unsigned)
 Copied from CRM #[id number]. Topic: Clinical - Medical Advice >> Nov 02, 2024  1:57 PM Nathanel DEL wrote: Reason for CRM: pt states she got word from her insurance they will not pay for sleep study study the way the order is written.  They said  something about it not being medically necessary. They will pay for in home sleep study. Pt would like to know what Dr Kara would have her do.  Change the order,, or do in home?  Either way she says the sleep study scheduled will need to be cancelled.

## 2024-11-03 ENCOUNTER — Ambulatory Visit: Admitting: Pulmonary Disease

## 2024-11-03 NOTE — Telephone Encounter (Signed)
 Vm / LM okay per DPR - ( updated the info and sent a new order)

## 2024-11-03 NOTE — Telephone Encounter (Signed)
 Spoke with patient Ebony Arellano. Look into what Dx needs to be to cover before ordering HST

## 2024-11-03 NOTE — Telephone Encounter (Signed)
 Home sleep study is ok  Thanks, JD

## 2024-11-18 ENCOUNTER — Telehealth: Payer: Self-pay

## 2024-11-18 NOTE — Telephone Encounter (Signed)
 OSA order faxed to [facility]

## 2024-12-10 ENCOUNTER — Other Ambulatory Visit: Payer: Self-pay | Admitting: Family Medicine

## 2024-12-10 DIAGNOSIS — Z8619 Personal history of other infectious and parasitic diseases: Secondary | ICD-10-CM

## 2024-12-15 ENCOUNTER — Ambulatory Visit (HOSPITAL_BASED_OUTPATIENT_CLINIC_OR_DEPARTMENT_OTHER): Admitting: Pulmonary Disease

## 2024-12-23 ENCOUNTER — Other Ambulatory Visit (HOSPITAL_BASED_OUTPATIENT_CLINIC_OR_DEPARTMENT_OTHER): Payer: Self-pay | Admitting: Cardiovascular Disease

## 2024-12-30 ENCOUNTER — Encounter: Payer: Self-pay | Admitting: Family Medicine

## 2024-12-30 ENCOUNTER — Ambulatory Visit: Admitting: Family Medicine

## 2024-12-30 VITALS — BP 132/84 | HR 107 | Temp 98.4°F | Ht 65.0 in | Wt 180.2 lb

## 2024-12-30 DIAGNOSIS — Z0000 Encounter for general adult medical examination without abnormal findings: Secondary | ICD-10-CM

## 2024-12-30 DIAGNOSIS — Z7184 Encounter for health counseling related to travel: Secondary | ICD-10-CM

## 2024-12-30 DIAGNOSIS — Z1231 Encounter for screening mammogram for malignant neoplasm of breast: Secondary | ICD-10-CM | POA: Diagnosis not present

## 2024-12-30 DIAGNOSIS — Z23 Encounter for immunization: Secondary | ICD-10-CM | POA: Diagnosis not present

## 2024-12-30 DIAGNOSIS — I10 Essential (primary) hypertension: Secondary | ICD-10-CM

## 2024-12-30 DIAGNOSIS — E782 Mixed hyperlipidemia: Secondary | ICD-10-CM | POA: Diagnosis not present

## 2024-12-30 MED ORDER — MEFLOQUINE HCL 250 MG PO TABS: 250.0000 mg | ORAL_TABLET | ORAL | 0 refills | Status: AC

## 2024-12-30 NOTE — Patient Instructions (Signed)
 Have fun on your trip.  Let us  know if you need anything.  The risk for mefloquine  a weekly antimalarial medicine was sent to your pharmacy.  You can start taking it 1 tab weekly starting 1-2 weeks prior to travel, continuing weekly during travel, and continuing weekly for 4 weeks after return from travel.  If you feel lightheaded, fatigued, nauseous while taking the medication you can stop it.

## 2024-12-30 NOTE — Progress Notes (Addendum)
 "  Established Patient Office Visit   Subjective  Patient ID: Ebony Arellano, female    DOB: 31-Jan-1966  Age: 59 y.o. MRN: [id number]  Chief Complaint  Patient presents with   Annual Exam    Patient came in today for a CPE, patient has been having congestion since Nov, patient went out of work felt better but when she went back to work the congestion came back     Pt is a 59 yo female seen for CPE.  Needs to schedule mammogram.  Pt endorses congestion since Nov.  Works at [employer].  Typically feels like this when returns to work after a break.  Pt traveling to [place] 3/19-3/31 for a cruise.  will also be in [place] and [place].  Does not plan to do any rural traveling during the trip.  Inquires if he needs yellow fever vaccine as it was suggested but not required.    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   Snoring 06/06/2022   Essential hypertension 10/27/1992   Past Medical History:  Diagnosis Date   Allergy Do not recall   Essential hypertension 10/27/1992   Pure hypercholesterolemia 11/05/2022   Seasonal allergies    Sleep apnea Recently   Snoring 06/06/2022   Past Surgical History:  Procedure Laterality Date   BREAST LUMPECTOMY Right    MASS EXCISION N/A 05/28/2022   Procedure: EXCISION MASS POSTERIOR NECK;  Surgeon: Sebastian Moles, MD;  Location: [facility] OR;  Service: General;  Laterality: N/A;   Social History[1] Family History  Problem Relation Age of Onset   Diabetes Mother    Asthma Mother    Hypertension Mother    Early death Father    Asthma Sister    Hypertension Brother    Diabetes Brother    Diabetes Brother    Early death Maternal Grandmother    Allergies[2]  ROS Negative unless stated above    Objective:     BP 132/84 (BP Location: Left Arm, Patient Position: Sitting, Cuff Size: Large)   Pulse (!) 107   Temp 98.4 F (36.9 C) (Oral)    Ht 5' 5 (1.651 m)   Wt 180 lb 3.2 oz (81.7 kg)   SpO2 98%   BMI 29.99 kg/m  BP Readings from Last 3 Encounters:  12/30/24 132/84  10/08/24 131/84  09/15/24 129/82   Wt Readings from Last 3 Encounters:  12/30/24 180 lb 3.2 oz (81.7 kg)  10/08/24 179 lb (81.2 kg)  09/15/24 181 lb (82.1 kg)      Physical Exam Constitutional:      Appearance: Normal appearance.  HENT:     Head: Normocephalic and atraumatic.     Right Ear: Tympanic membrane, ear canal and external ear normal.     Left Ear: Tympanic membrane, ear canal and external ear normal.     Nose: Nose normal.     Mouth/Throat:     Mouth: Mucous membranes are moist.     Pharynx: No oropharyngeal exudate or posterior oropharyngeal erythema.  Eyes:     General: No scleral icterus.    Extraocular Movements: Extraocular movements intact.     Conjunctiva/sclera: Conjunctivae normal.     Pupils: Pupils are equal, round, and reactive to light.  Neck:     Thyroid: No thyromegaly.     Vascular: No carotid bruit.  Cardiovascular:     Rate and Rhythm: Normal rate and regular  rhythm.     Pulses: Normal pulses.     Heart sounds: Normal heart sounds. No murmur heard.    No friction rub.  Pulmonary:     Effort: Pulmonary effort is normal.     Breath sounds: Normal breath sounds. No wheezing, rhonchi or rales.  Abdominal:     General: Bowel sounds are normal.     Palpations: Abdomen is soft.     Tenderness: There is no abdominal tenderness.  Musculoskeletal:        General: No deformity. Normal range of motion.  Lymphadenopathy:     Cervical: No cervical adenopathy.  Skin:    General: Skin is warm and dry.     Findings: No lesion.  Neurological:     General: No focal deficit present.     Mental Status: She is alert and oriented to person, place, and time.  Psychiatric:        Mood and Affect: Mood normal.        Thought Content: Thought content normal.        12/30/2024    3:34 PM 12/25/2023   11:55 AM 10/28/2023     3:31 PM  Depression screen PHQ 2/9  Decreased Interest 0 1 1  Down, Depressed, Hopeless 0 1 1  PHQ - 2 Score 0 2 2  Altered sleeping 2 1 2   Tired, decreased energy 0 1 2  Change in appetite 0 1 2  Feeling bad or failure about yourself  0 0 0  Trouble concentrating 0 1 1  Moving slowly or fidgety/restless 0 0 0  Suicidal thoughts 0 0 0  PHQ-9 Score 2 6  9    Difficult doing work/chores Not difficult at all Not difficult at all Somewhat difficult     Data saved with a previous flowsheet row definition      12/30/2024    3:35 PM 12/25/2023   11:55 AM 10/28/2023    3:31 PM 04/29/2023    4:28 PM  GAD 7 : Generalized Anxiety Score  Nervous, Anxious, on Edge 0 0  0  Control/stop worrying 0 0  0  Worry too much - different things 0 0  0  Trouble relaxing 0 1 2 0  Restless 0 0  0  Easily annoyed or irritable 0 1 2 0  Afraid - awful might happen 0 0  0  Total GAD 7 Score 0 2  0  Anxiety Difficulty Not difficult at all Not difficult at all  Not difficult at all     No results found for any visits on 12/30/24.    Assessment & Plan:   Well adult exam -     CBC with Differential/Platelet; Future -     Comprehensive metabolic panel with GFR; Future -     Hemoglobin A1c; Future -     Lipid panel; Future -     T4, free; Future -     TSH; Future  Essential hypertension -     Comprehensive metabolic panel with GFR; Future -     T4, free; Future -     TSH; Future  Need for Tdap vaccination -     Tdap vaccine greater than or equal to 7yo IM  Need for influenza vaccination -     Flu vaccine trivalent PF, 6mos and older(Flulaval,Afluria,Fluarix,Fluzone)  Travel advice encounter -     Mefloquine  HCl; Take 1 tablet (250 mg total) by mouth every 7 (seven) days. Start taking 1 tab weekly 1-2  wks before trip.  Continue taking 1 tab weekly for 4 wks after returning from trip.  Dispense: 8 tablet; Refill: 0  Encounter for screening mammogram for malignant neoplasm of breast -     3D  Screening Mammogram, Left and Right; Future  Mixed hyperlipidemia  Age appropriate health screenings discussed.  Obtain labs.  Immunizations reviewed.  Influenza and Tdap given this visit.  Mammogram due as last done 01/17/2024.  Referral placed.  Colonoscopy up-to-date done 04/30/2022.  BP slightly elevated but controlled.  Continue lifestyle modifications and current medications including chlorthalidone  12.5 mg daily.  Continue rosuvastatin  20 mg daily for HLD.  Travel advice reviewed.  Malaria prophylaxis provided, r/b/a discussed.  Follow-up as needed.  Next CPE in 1 year.  Return in about 1 year (around 12/30/2025), or if symptoms worsen or fail to improve, for physical.   Ebony JONELLE Single, MD     [1]  Social History Tobacco Use   Smoking status: Never   Smokeless tobacco: Never  Substance Use Topics   Alcohol use: Yes    Comment: Occassionally   Drug use: Never  [2]  Allergies Allergen Reactions   Actical Shortness Of Breath   Multiple Minerals-Vitamins Shortness Of Breath    Cigarette Smoke   Viteyes Smokers Formula-Lutein [Actical] Shortness Of Breath    Cigarette Smoke    Bee Pollen Itching and Other (See Comments)    Sneezing   Grass Pollen([id number]-Swt Vern) Itching, Other (See Comments) and Cough    Sneezing   Pollen Extract-Tree Extract [Pollen Extract] Itching, Other (See Comments) and Cough    Sneezing   Cat Dander Itching   Dust Mite Extract Itching   Latex Itching and Swelling   "

## 2024-12-31 LAB — COMPREHENSIVE METABOLIC PANEL WITH GFR
ALT: 25 U/L (ref 3–35)
AST: 24 U/L (ref 5–37)
Albumin: 4.6 g/dL (ref 3.5–5.2)
Alkaline Phosphatase: 61 U/L (ref 39–117)
BUN: 14 mg/dL (ref 6–23)
CO2: 29 meq/L (ref 19–32)
Calcium: 9.5 mg/dL (ref 8.4–10.5)
Chloride: 102 meq/L (ref 96–112)
Creatinine, Ser: 0.79 mg/dL (ref 0.40–1.20)
GFR: 81.47 mL/min
Glucose, Bld: 87 mg/dL (ref 70–99)
Potassium: 3.3 meq/L — ABNORMAL LOW (ref 3.5–5.1)
Sodium: 139 meq/L (ref 135–145)
Total Bilirubin: 0.6 mg/dL (ref 0.2–1.2)
Total Protein: 7.8 g/dL (ref 6.0–8.3)

## 2024-12-31 LAB — CBC WITH DIFFERENTIAL/PLATELET
Basophils Absolute: 0.1 K/uL (ref 0.0–0.1)
Basophils Relative: 1 % (ref 0.0–3.0)
Eosinophils Absolute: 0.1 K/uL (ref 0.0–0.7)
Eosinophils Relative: 1.9 % (ref 0.0–5.0)
HCT: 37.8 % (ref 36.0–46.0)
Hemoglobin: 13 g/dL (ref 12.0–15.0)
Lymphocytes Relative: 23.3 % (ref 12.0–46.0)
Lymphs Abs: 1.5 K/uL (ref 0.7–4.0)
MCHC: 34.3 g/dL (ref 30.0–36.0)
MCV: 88.4 fl (ref 78.0–100.0)
Monocytes Absolute: 0.5 K/uL (ref 0.1–1.0)
Monocytes Relative: 8.5 % (ref 3.0–12.0)
Neutro Abs: 4.1 K/uL (ref 1.4–7.7)
Neutrophils Relative %: 65.3 % (ref 43.0–77.0)
Platelets: 282 K/uL (ref 150.0–400.0)
RBC: 4.28 Mil/uL (ref 3.87–5.11)
RDW: 13.5 % (ref 11.5–15.5)
WBC: 6.3 K/uL (ref 4.0–10.5)

## 2024-12-31 LAB — HEMOGLOBIN A1C: Hgb A1c MFr Bld: 6.4 % (ref 4.6–6.5)

## 2024-12-31 LAB — LIPID PANEL
Cholesterol: 107 mg/dL (ref 28–200)
HDL: 46.1 mg/dL
LDL Cholesterol: 37 mg/dL (ref 10–99)
NonHDL: 60.74
Total CHOL/HDL Ratio: 2
Triglycerides: 121 mg/dL (ref 10.0–149.0)
VLDL: 24.2 mg/dL (ref 0.0–40.0)

## 2024-12-31 LAB — TSH: TSH: 1.59 u[IU]/mL (ref 0.35–5.50)

## 2024-12-31 LAB — T4, FREE: Free T4: 0.87 ng/dL (ref 0.60–1.60)

## 2025-01-03 ENCOUNTER — Ambulatory Visit: Payer: Self-pay | Admitting: Family Medicine

## 2025-01-03 DIAGNOSIS — E876 Hypokalemia: Secondary | ICD-10-CM

## 2025-01-03 MED ORDER — POTASSIUM CHLORIDE CRYS ER 20 MEQ PO TBCR: 20.0000 meq | EXTENDED_RELEASE_TABLET | Freq: Every day | ORAL | 3 refills | Status: AC

## 2025-01-18 ENCOUNTER — Ambulatory Visit

## 2025-01-18 ENCOUNTER — Other Ambulatory Visit: Payer: Self-pay | Admitting: Cardiovascular Disease

## 2025-01-20 ENCOUNTER — Other Ambulatory Visit: Payer: Self-pay | Admitting: Family Medicine

## 2025-01-20 ENCOUNTER — Other Ambulatory Visit (HOSPITAL_BASED_OUTPATIENT_CLINIC_OR_DEPARTMENT_OTHER): Payer: Self-pay | Admitting: Cardiovascular Disease

## 2025-01-20 DIAGNOSIS — Z8619 Personal history of other infectious and parasitic diseases: Secondary | ICD-10-CM

## 2025-01-21 MED ORDER — ROSUVASTATIN CALCIUM 20 MG PO TABS: 20.0000 mg | ORAL_TABLET | Freq: Every day | ORAL | 0 refills | Status: AC

## 2025-01-21 NOTE — Telephone Encounter (Signed)
 Lipid Completed on 12/30/24

## 2025-01-22 ENCOUNTER — Inpatient Hospital Stay: Admission: RE | Admit: 2025-01-22

## 2025-01-22 DIAGNOSIS — Z1231 Encounter for screening mammogram for malignant neoplasm of breast: Secondary | ICD-10-CM
